# Patient Record
Sex: Male | Born: 1955 | Race: White | Hispanic: No | Marital: Married | Smoking: Never smoker
Health system: Southern US, Community
[De-identification: ages and names within clinical notes are randomized; demographics above are authoritative.]

## PROBLEM LIST (undated history)

## (undated) DIAGNOSIS — M1990 Unspecified osteoarthritis, unspecified site: Secondary | ICD-10-CM

## (undated) DIAGNOSIS — K219 Gastro-esophageal reflux disease without esophagitis: Secondary | ICD-10-CM

## (undated) DIAGNOSIS — M722 Plantar fascial fibromatosis: Secondary | ICD-10-CM

## (undated) DIAGNOSIS — E7800 Pure hypercholesterolemia, unspecified: Secondary | ICD-10-CM

## (undated) DIAGNOSIS — T8859XA Other complications of anesthesia, initial encounter: Secondary | ICD-10-CM

## (undated) HISTORY — PX: HERNIA REPAIR: SHX51

## (undated) HISTORY — PX: SHOULDER ARTHROSCOPY: SHX128

## (undated) HISTORY — PX: LEG SURGERY: SHX1003

## (undated) HISTORY — DX: Plantar fascial fibromatosis: M72.2

## (undated) HISTORY — PX: FRACTURE SURGERY: SHX138

## (undated) HISTORY — PX: COLONOSCOPY: SHX174

---

## 2021-02-23 ENCOUNTER — Emergency Department (HOSPITAL_COMMUNITY): Payer: Medicare PPO

## 2021-02-23 ENCOUNTER — Encounter (HOSPITAL_COMMUNITY): Payer: Self-pay

## 2021-02-23 ENCOUNTER — Other Ambulatory Visit: Payer: Self-pay

## 2021-02-23 ENCOUNTER — Emergency Department (HOSPITAL_COMMUNITY)
Admission: EM | Admit: 2021-02-23 | Discharge: 2021-02-23 | Disposition: A | Discharge status: Home / Self Care | Payer: Medicare PPO | Attending: Emergency Medicine | Admitting: Emergency Medicine

## 2021-02-23 DIAGNOSIS — R002 Palpitations: Secondary | ICD-10-CM | POA: Insufficient documentation

## 2021-02-23 DIAGNOSIS — R0789 Other chest pain: Secondary | ICD-10-CM | POA: Insufficient documentation

## 2021-02-23 DIAGNOSIS — Z5321 Procedure and treatment not carried out due to patient leaving prior to being seen by health care provider: Secondary | ICD-10-CM | POA: Insufficient documentation

## 2021-02-23 HISTORY — DX: Pure hypercholesterolemia, unspecified: E78.00

## 2021-02-23 LAB — CBC
HCT: 42 % (ref 39.0–52.0)
Hemoglobin: 14 g/dL (ref 13.0–17.0)
MCH: 31.1 pg (ref 26.0–34.0)
MCHC: 33.3 g/dL (ref 30.0–36.0)
MCV: 93.3 fL (ref 80.0–100.0)
Platelets: 181 10*3/uL (ref 150–400)
RBC: 4.5 MIL/uL (ref 4.22–5.81)
RDW: 12.9 % (ref 11.5–15.5)
WBC: 7.6 10*3/uL (ref 4.0–10.5)
nRBC: 0 % (ref 0.0–0.2)

## 2021-02-23 LAB — BASIC METABOLIC PANEL
Anion gap: 5 (ref 5–15)
BUN: 17 mg/dL (ref 8–23)
CO2: 28 mmol/L (ref 22–32)
Calcium: 9.1 mg/dL (ref 8.9–10.3)
Chloride: 106 mmol/L (ref 98–111)
Creatinine, Ser: 0.94 mg/dL (ref 0.61–1.24)
GFR, Estimated: >60 mL/min (ref >60)
Glucose, Bld: 96 mg/dL (ref 70–99)
Potassium: 3.9 mmol/L (ref 3.5–5.1)
Sodium: 139 mmol/L (ref 135–145)

## 2021-02-23 LAB — TROPONIN I (HIGH SENSITIVITY): Troponin I (High Sensitivity): 2 ng/L (ref <18)

## 2021-02-23 IMAGING — CR DG CHEST 2V
2 series · 2 of 2 positions shown · non-contrast
Comparison: None.

CLINICAL DATA: Chest pain

EXAM:
CHEST - 2 VIEW

[w chest pa]
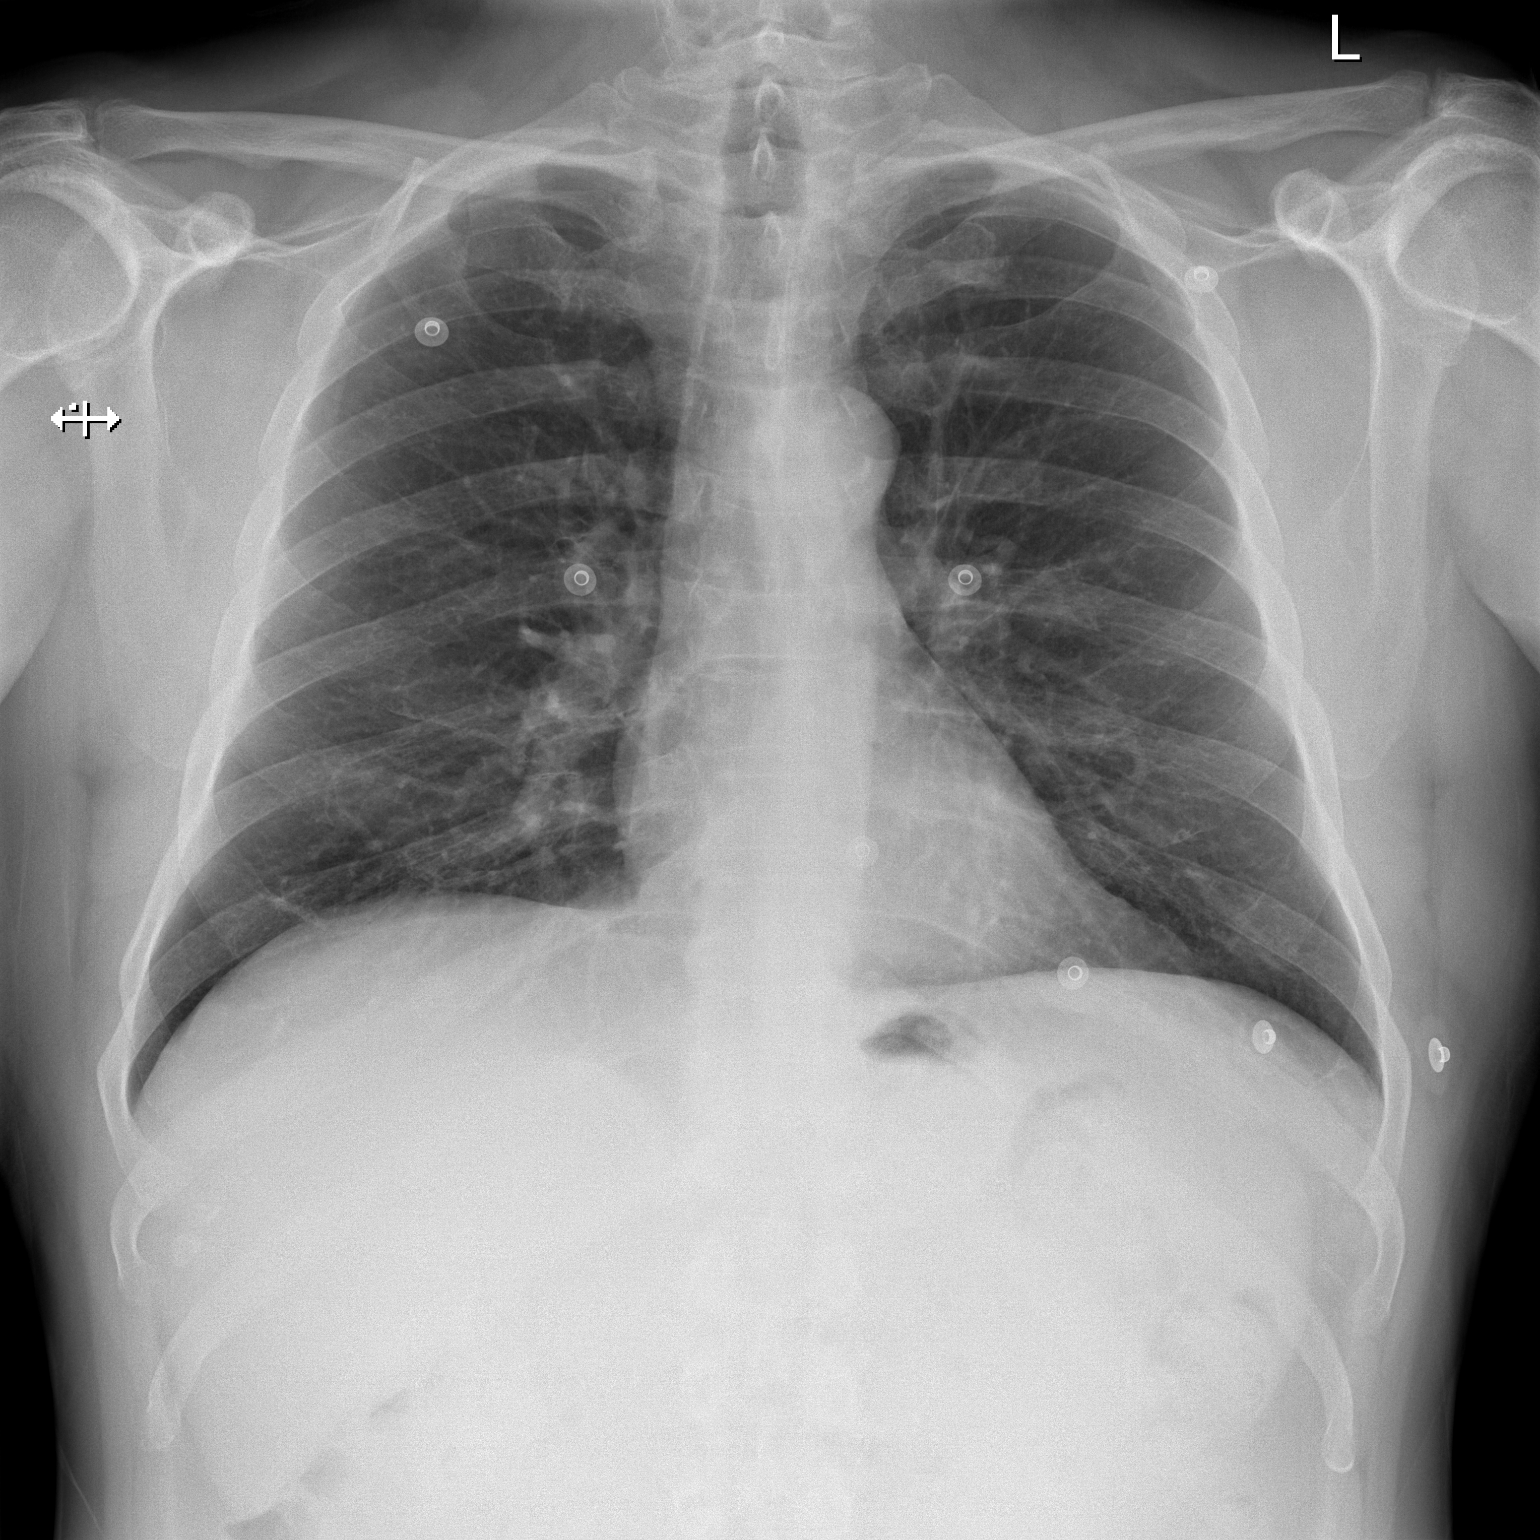

[w chest lat]
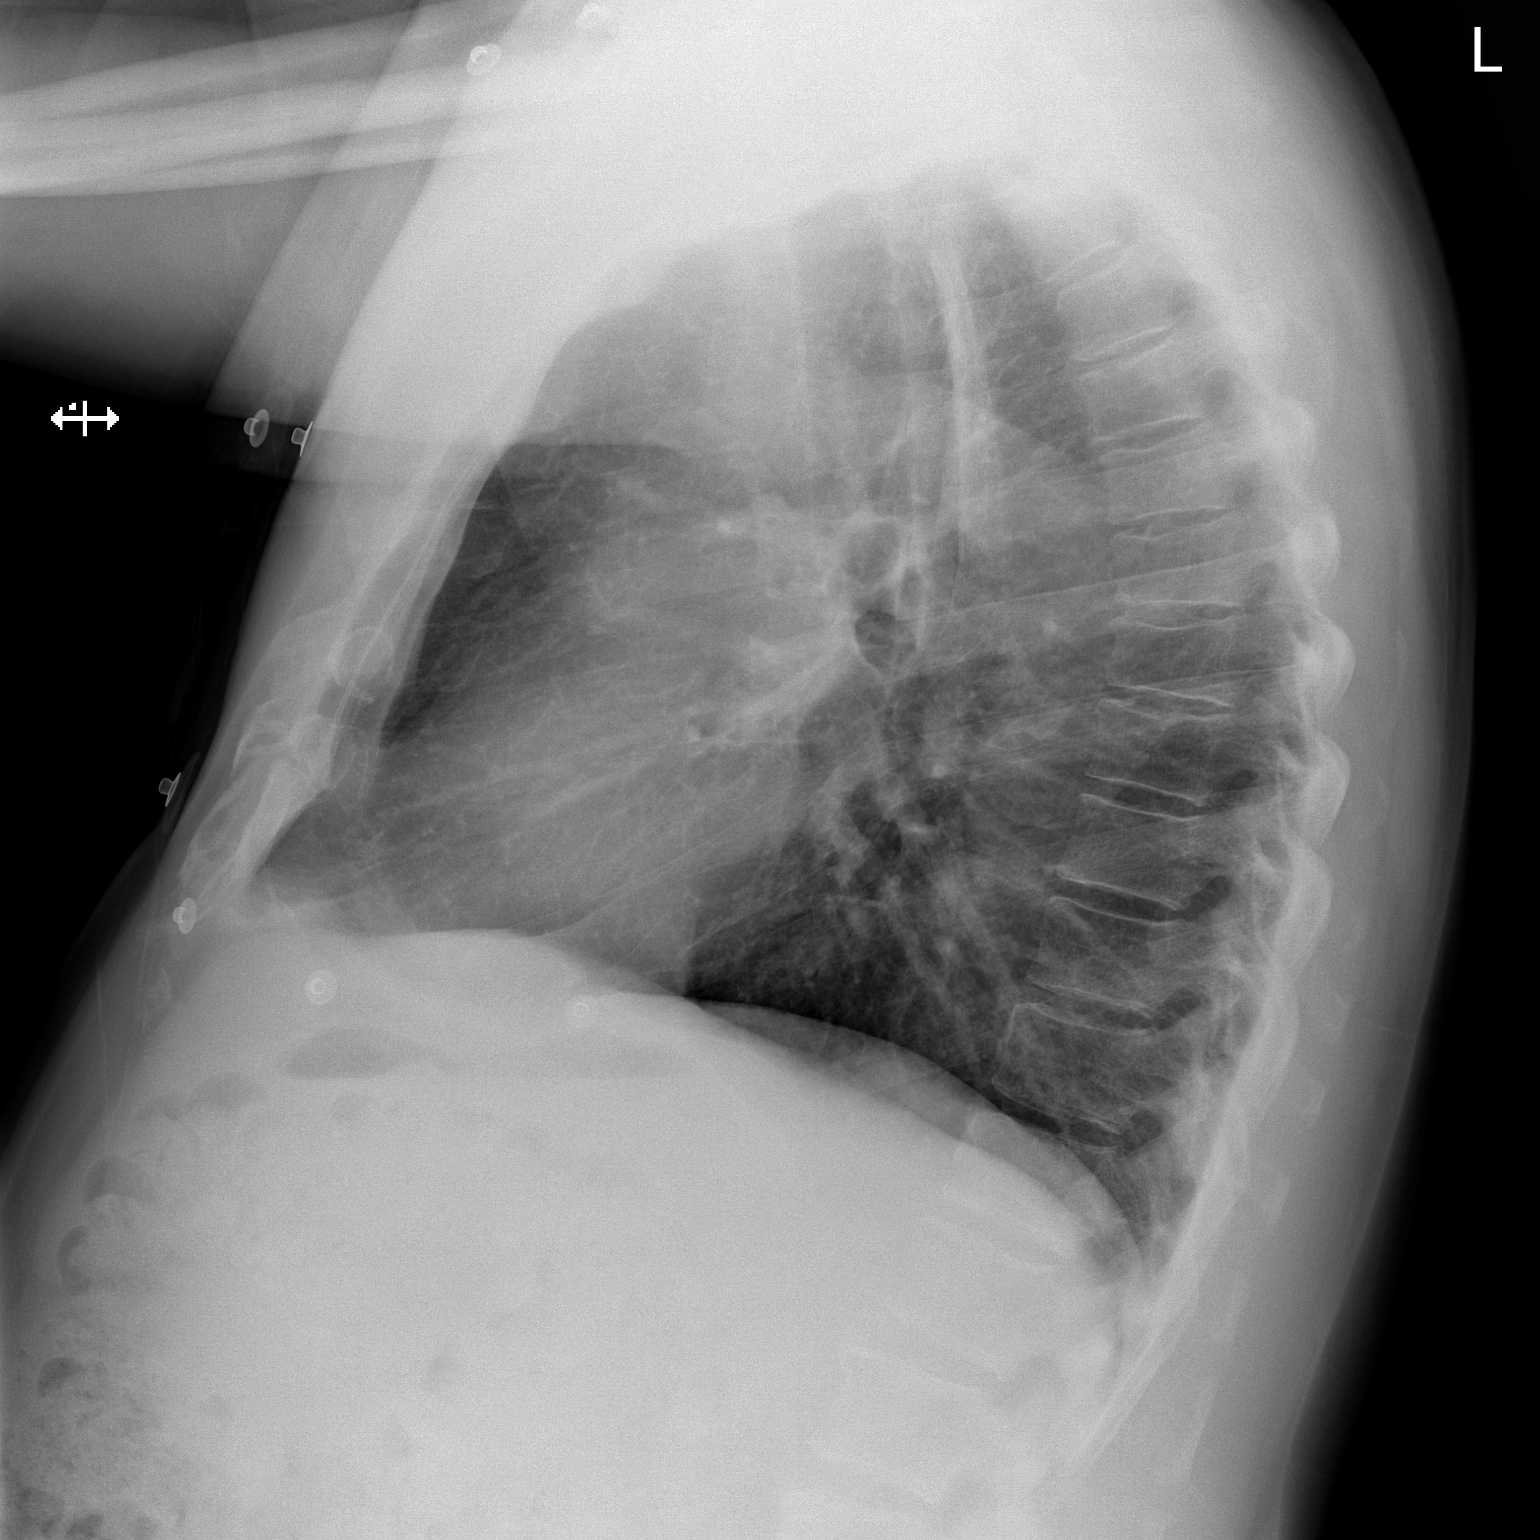

[2 of 2 positions shown; findings below may reference images not displayed]

FINDINGS: The heart size and mediastinal contours are within normal limits.
Both lungs are clear. No pleural effusion or pneumothorax. No acute
osseous abnormality. Mild chronic midthoracic compression
deformities.
IMPRESSION: No acute process in the chest.

## 2021-02-23 NOTE — ED Notes (Signed)
PT stated he was leaving

## 2021-02-23 NOTE — ED Provider Notes (Signed)
Emergency Medicine Provider Triage Evaluation Note  Jimmy Huynh 65 y.o. male was evaluated in triage.  Pt complains of episode of chest pain that occurred 3 days ago.  He reports that 3 days ago developed 11:30 p.m. at night, he had an episode of left-sided chest pain that he describes as sharp.  He did not have any associated shortness of breath, nausea/vomiting.  He states that it resolved.  He has not had any episodes since then.  Yesterday he did notice some palpitations.  He called his doctor this morning and was advised to come to emergency department for further evaluation.  Currently denies any chest pain.  Denies any fevers, cough, nausea/vomiting.   Review of Systems  Positive: CP, palpiations Negative: N/v, fever  Physical Exam  BP 134/82   Pulse 70   Temp 98.2 F (36.8 C) (Oral)   Resp 18   Ht 5\' 4"  (1.626 m)   Wt 65.8 kg   SpO2 100%   BMI 24.89 kg/m  Gen:   Awake, no distress   HEENT:  Atraumatic  Resp:  Normal effort  Cardiac:  Normal rate  Abd:   Nondistended, nontender  MSK:   Moves extremities without difficulty  Neuro:  Speech clear   Other:      Medical Decision Making  Medically screening exam initiated at 12:22 PM  Appropriate orders placed.  Jimmy Huynh was informed that the remainder of the evaluation will be completed by another provider, this initial triage assessment does not replace that evaluation. They are counseled that they will need to remain in the ED until the completion of their workup, including full H&P and results of any tests.  Risks of leaving the emergency department prior to completion of treatment were discussed. Patient was advised to inform ED staff if they are leaving before their treatment is complete. The patient acknowledged these risks and time was allowed for questions.     The patient appears stable so that the remainder of the MSE may be completed by another provider.   Clinical Impression  CP   Portions of this note  were generated with Dragon dictation software. Dictation errors may occur despite best attempts at proofreading.      Jimmy Quant, PA-C 02/23/21 1223    02/25/21, DO 02/23/21 1523

## 2021-02-23 NOTE — ED Triage Notes (Addendum)
Patient states he had left chest pain that radiated into the left back x 10 minutes 3 days ago and states none since. Patient states he called his physician today and was told to come to the ED.  Patient added that he had palpitations yesterday while sleeping and it woke him up.

## 2021-04-14 ENCOUNTER — Other Ambulatory Visit: Payer: Self-pay | Admitting: Student

## 2021-04-14 DIAGNOSIS — M25561 Pain in right knee: Secondary | ICD-10-CM

## 2021-04-14 DIAGNOSIS — G8929 Other chronic pain: Secondary | ICD-10-CM

## 2021-04-14 DIAGNOSIS — S83241A Other tear of medial meniscus, current injury, right knee, initial encounter: Secondary | ICD-10-CM

## 2021-04-23 ENCOUNTER — Other Ambulatory Visit: Payer: Self-pay

## 2021-04-23 ENCOUNTER — Ambulatory Visit
Admission: RE | Admit: 2021-04-23 | Discharge: 2021-04-23 | Disposition: A | Discharge status: Home / Self Care | Payer: Medicare PPO | Source: Ambulatory Visit | Attending: Student | Admitting: Student

## 2021-04-23 DIAGNOSIS — S83241A Other tear of medial meniscus, current injury, right knee, initial encounter: Secondary | ICD-10-CM | POA: Insufficient documentation

## 2021-04-23 DIAGNOSIS — G8929 Other chronic pain: Secondary | ICD-10-CM | POA: Diagnosis present

## 2021-04-23 DIAGNOSIS — M25561 Pain in right knee: Secondary | ICD-10-CM | POA: Diagnosis present

## 2021-04-23 IMAGING — MR MR KNEE*R* W/O CM
7 series · 40 of 40 positions shown · non-contrast
Comparison: None.

CLINICAL DATA: Chronic right anterior knee pain.  No prior surgery.

EXAM:
MRI OF THE RIGHT KNEE WITHOUT CONTRAST
TECHNIQUE: Multiplanar, multisequence MR imaging of the knee was performed. No
intravenous contrast was administered.

[Series 15: T2 fat-sat · axial · right · 4.0mm · 0.50mm/px · z∈[-85,+39]mm · 6 of 26 slices shown (1 of 3)]
[im 1/26]
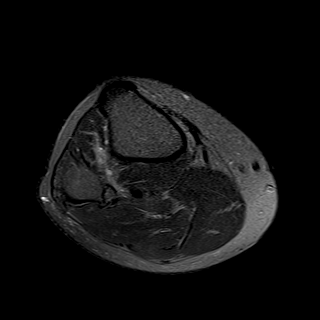
[im 6/26]
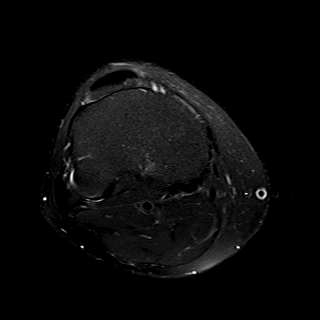
[im 11/26]
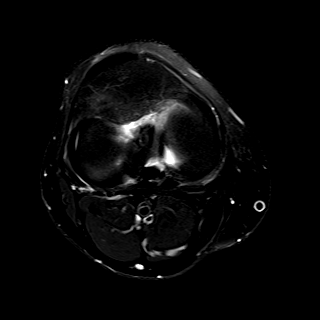
[im 16/26]
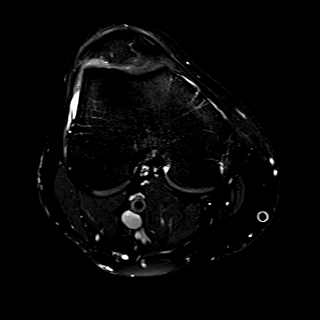
[im 21/26]
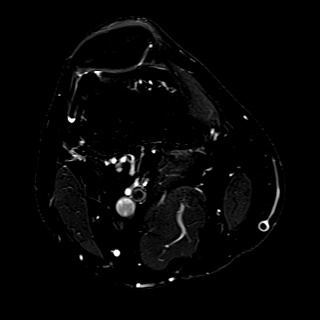
[im 26/26]
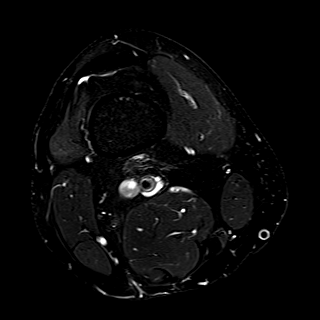

[Series 16: T1 · coronal · right · 4.0mm · 0.47mm/px · 6 of 28 slices shown]
[im 1/28]
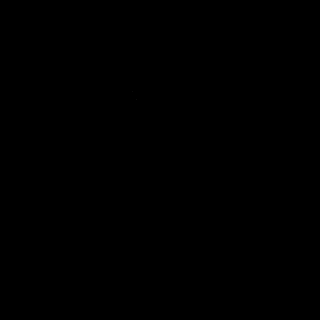
[im 6/28]
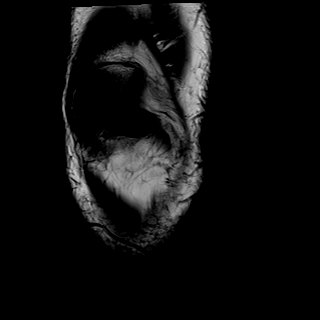
[im 11/28]
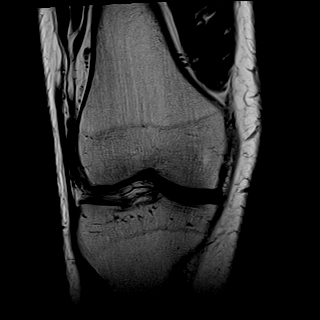
[im 17/28]
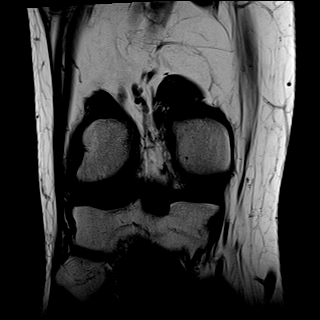
[im 22/28]
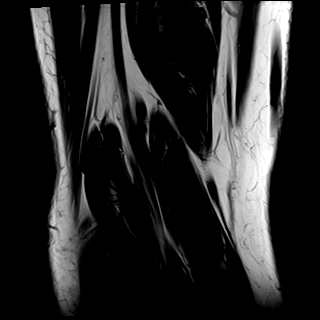
[im 28/28]
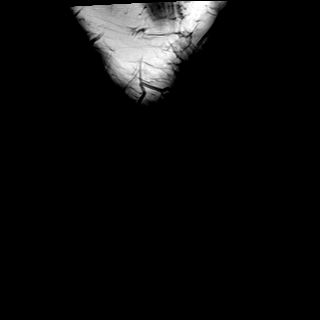

[Series 17: T2 fat-sat · coronal · right · 4.0mm · 0.47mm/px · 6 of 28 slices shown (2 of 3)]
[im 1/28]
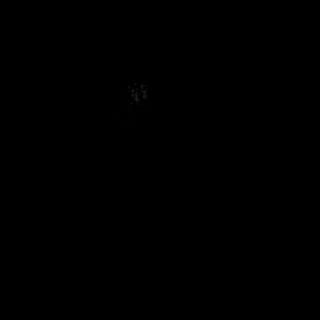
[im 6/28]
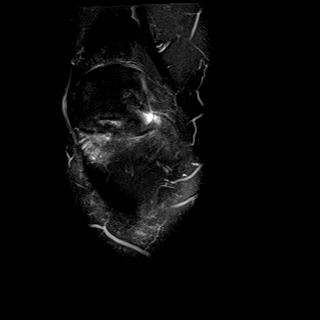
[im 11/28]
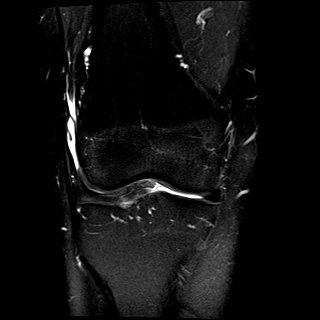
[im 17/28]
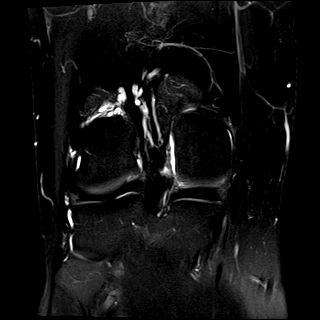
[im 22/28]
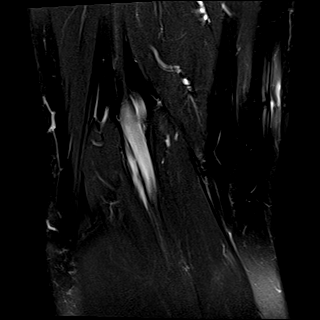
[im 28/28]
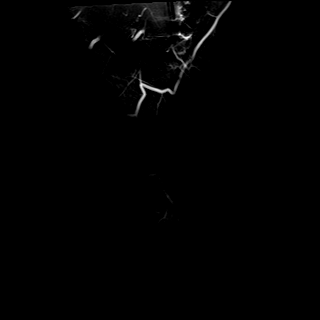

[Series 18: PD fat-sat · coronal · right · 4.0mm · 0.59mm/px · 6 of 28 slices shown (1 of 2)]
[im 1/28]
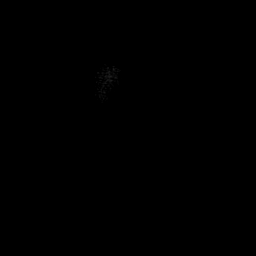
[im 6/28]
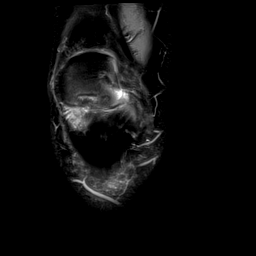
[im 11/28]
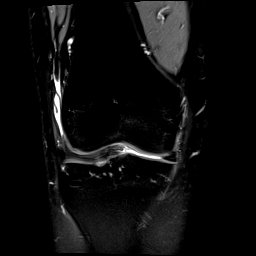
[im 17/28]
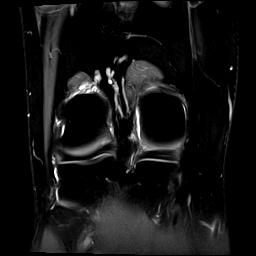
[im 22/28]
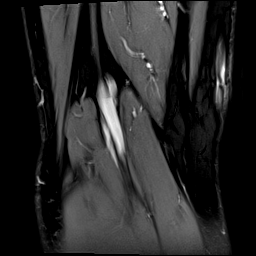
[im 28/28]
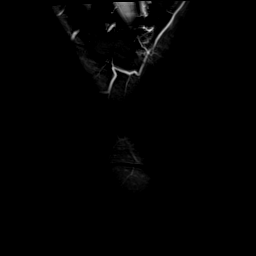

[Series 19: PD fat-sat · sagittal · right · 3.0mm · 0.47mm/px · 7 of 34 slices shown (2 of 2)]
[im 1/34]
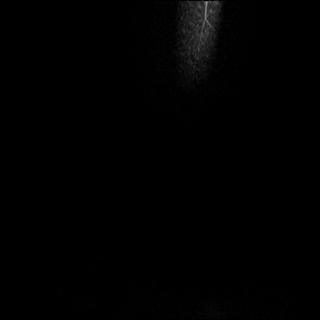
[im 6/34]
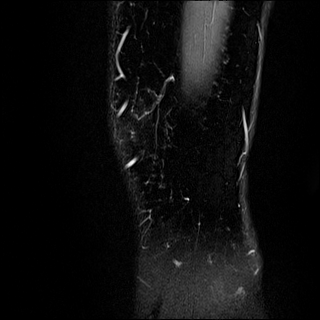
[im 12/34]
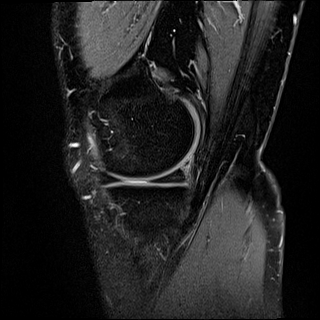
[im 17/34]
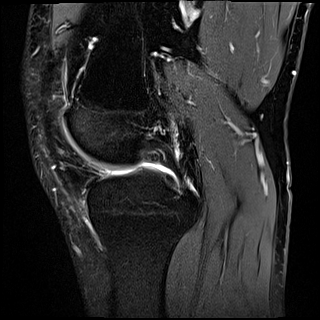
[im 23/34]
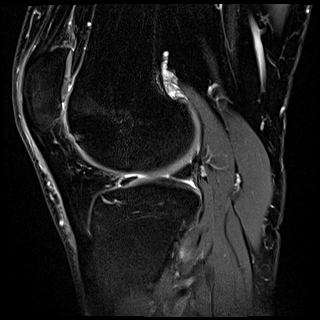
[im 28/34]
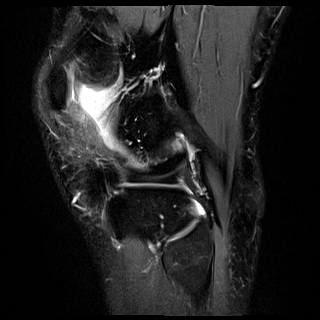
[im 34/34]
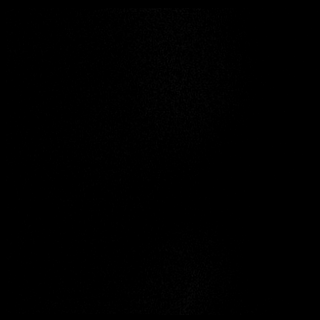

[Series 20: T2 fat-sat · sagittal · right · 3.0mm · 0.47mm/px · 7 of 33 slices shown (3 of 3)]
[im 1/33]
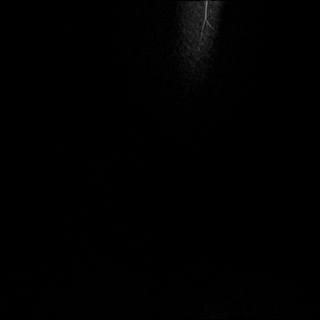
[im 6/33]
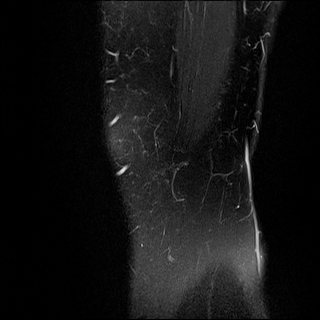
[im 11/33]
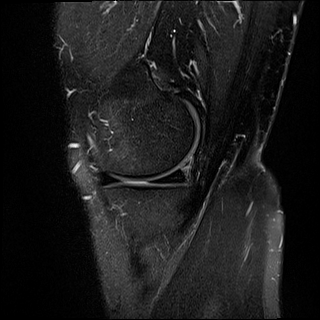
[im 17/33]
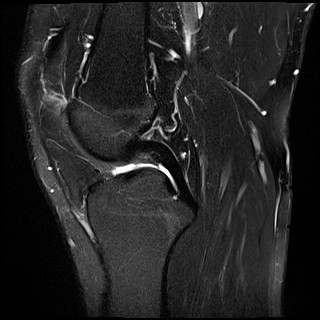
[im 22/33]
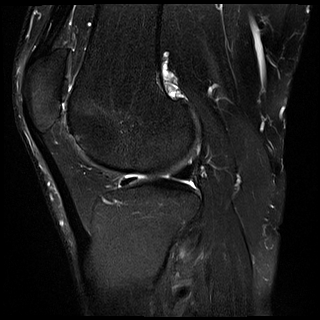
[im 27/33]
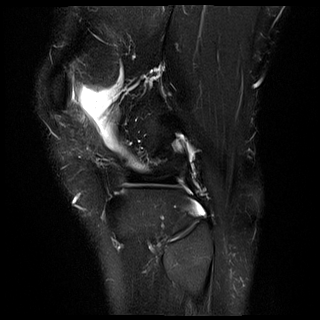
[im 33/33]
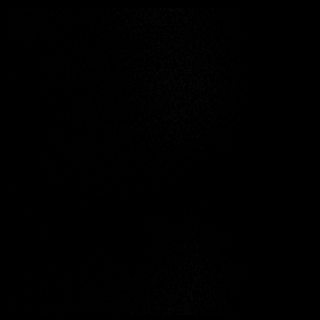

[Series 21: PD · coronal · right · 2.0mm · 0.47mm/px · 2 of 10 slices shown]
[im 1/10]
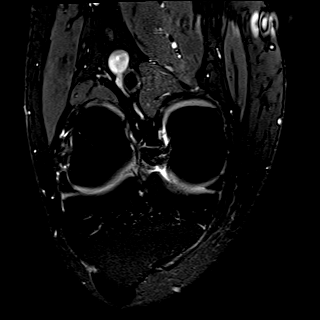
[im 10/10]
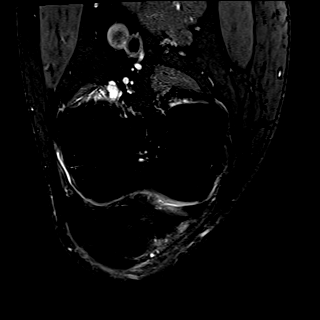

[40 of 40 positions shown; findings below may reference images not displayed]

FINDINGS: MENISCI

Medial meniscus:  Intact.

Lateral meniscus:  Intact.

LIGAMENTS

Cruciates:  Intact ACL and PCL.

Collaterals: Medial collateral ligament is intact. Lateral
collateral ligament complex is intact.

CARTILAGE

Patellofemoral: Focal full-thickness and near full-thickness
cartilage loss over the lateral trochlea and inferior lateral
patellar facet. Partial-thickness cartilage loss over the central
patellar apex.

Medial:  No chondral defect.

Lateral:  No chondral defect.

Joint: No joint effusion. Mild edema in the superolateral aspect of
Hoffa's fat.

Popliteal Fossa:  No Baker cyst. Intact popliteus tendon.

Extensor Mechanism: Intact quadriceps tendon and patellar tendon.
Intact medial and lateral patellar retinaculum. Intact MPFL.

Bones: No focal marrow signal abnormality. No fracture or
dislocation.

Other: None.
IMPRESSION: 1. No meniscal or ligamentous injury.
2. Mild to moderate lateral patellofemoral compartment
osteoarthritis.
3. Mild edema in the superolateral aspect of Hoffa's fat pad, which
can be seen in the setting of patellar tendon-lateral femoral
condyle friction syndrome.

## 2021-10-30 NOTE — Progress Notes (Signed)
Virtual Visit via Video Note  I connected with Jimmy Huynh on 11/04/21 at 11:00 AM EST by a video enabled telemedicine application and verified that I am speaking with the correct person using two identifiers.  Location: Patient: home Provider: office Persons participated in the visit- patient, provider    I discussed the limitations of evaluation and management by telemedicine and the availability of in person appointments. The patient expressed understanding and agreed to proceed.   I discussed the assessment and treatment plan with the patient. The patient was provided an opportunity to ask questions and all were answered. The patient agreed with the plan and demonstrated an understanding of the instructions.   The patient was advised to call back or seek an in-person evaluation if the symptoms worsen or if the condition fails to improve as anticipated.  I provided 59 minutes of non-face-to-face time during this encounter.   Neysa Hotter, MD     Psychiatric Initial Adult Assessment   Patient Identification: Jimmy Huynh MRN:  [id number] Date of Evaluation:  11/04/2021 Referral Source: Jim Like, MD  Chief Complaint:   Chief Complaint   Establish Care   "I'm out of control"  Visit Diagnosis:    ICD-10-CM   1. GAD (generalized anxiety disorder)  F41.1       History of Present Illness:   Jimmy Huynh is a 66 y.o. year old male with a history of anxiety, high cholesterol, planter fasciitis, who is referred for anxiety.   He states that he made his appointment due to the symptoms he experiences.  He has been on venlafaxine for more than 20 years for anxiety and "ADHD".  He started to notice that he has difficult sensation, which she thinks is due to discontinuation symptoms.  He describes this as being in a "55 gallon of drum, rolling down the hill." He thinks he has "wobbly head" especially when he missed to take the medication.  He feels out of control, and "losing  it."  Although he tried to take additional dose around noon, it was not effective. Although his anxiety was extreme in the past, he does not think it has been interfering with his daily activity anymore.  He reports great relationship with his wife.  He also reports good relationship with his son at home.  His son had methampheamine use disorder, and overdosed on fentanyl in the past.  He has been in sobriety for the past year, and has been doing well.  Annette Stable enjoys working on home projects.  He is hoping to enjoy fishing in spring, referring to 3 boats he possesses.   Anxiety- He occasionally feels anxious about anything.  He talks about an example of him being worried about [place] dollar collapse.  However, he does not think it has been affecting him as much.  He feels irritable at times. He has good appetite.   Concentration- He was told by his PCP that he has adult ADHD.  He states that his focus improved significantly since he has been on venlafaxine.  He talks about an example of him being unable to work on car prior to starting this medication.  He reports family history of asperger disease in his brothter, father.   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   Substance-He started alcohol use at age 57.  He states that there was a culture of substance use when he was in college.  He used "every drug available," mainly cocaine and marijuana.  He discontinued the substance for his career/work at logistic distribution.  He also stopped drinking after the divorce.  He has been in sobriety since 66 year old.  He denies any craving.  He has not received any treatment for his substance use.  He finds his spirituality to be very helpful for sobriety.   Physical symptoms- He reports vertigo, tinnitus over the past several months.  He also has  optical migraine, which is excertabated by any stress.  He notices that it has improved since he reduced the dose of Aleve and Tylenol for the past 2 weeks.  He has an upcoming appointment with his neurologist.   Medication- venlafaxine XR 112.5 mg daly (on venlfaxine for 67 years)  Functional Status Instrumental Activities of Daily Living (IADLs):  Jimie Kuwahara is independent in the following: medications, driving Requires assistance with the following: managing finances (his wife has been handling this for many years)  Activities of Daily Living (ADLs):  Nole Robey is independent in the following: bathing and hygiene, feeding, continence, grooming and toileting, walking   Daily routine: work on the house project Support: wife Household: wife, 22 yo son with methamphetamine use disorder Marital status: married for 21 years, divorced before Number of children: 2 son (1 son was adopted). 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 Last PCP / ongoing medical evaluation:     Associated Signs/Symptoms: Depression Symptoms:   denies (Hypo) Manic Symptoms:   denies decreased need for sleep, euphoria Anxiety Symptoms:   mild anxiety Psychotic Symptoms:   denies AH, Vh, paranoia PTSD Symptoms: Had a traumatic exposure:  some trauma in childhood Re-experiencing:  None Hypervigilance:  No Hyperarousal:  None Avoidance:  None  Past Psychiatric History:  Outpatient: used to see a psychotherapist Psychiatry admission: denies Previous suicide attempt: denies  Past trials of medication: sertraline, fluoxetine, lexapro,  History of violence:  denies  Previous Psychotropic Medications: Yes   Substance Abuse History in the last 12 months:  No.  Consequences of Substance Abuse: NA  Past Medical History:  Past Medical History:  Diagnosis Date   High cholesterol    Plantar fasciitis     Past Surgical History:  Procedure Laterality  Date   HERNIA REPAIR     x3    LEG SURGERY Left     Family Psychiatric History:  As below  Family History:  Family History  Problem Relation Age of Onset   Alcohol abuse Maternal Uncle    Alcohol abuse Paternal Grandfather    Depression Niece     Social History:   Social History   Socioeconomic History   Marital status: Unknown    Spouse name: Not on file   Number of children: Not on file   Years of education: Not on file   Highest education level: Not on file  Occupational History   Not on file  Tobacco Use   Smoking status: Never   Smokeless tobacco: Never  Vaping Use   Vaping Use: Never used  Substance and Sexual Activity   Alcohol use: Not Currently   Drug use: Not Currently   Sexual activity: Not on file  Other Topics Concern   Not on file  Social History Narrative   Not on file   Social Determinants of Health   Financial  Resource Strain: Not on file  Food Insecurity: Not on file  Transportation Needs: Not on file  Physical Activity: Not on file  Stress: Not on file  Social Connections: Not on file    Additional Social History: as above  Allergies:  No Known Allergies  Metabolic Disorder Labs: No results found for: HGBA1C, MPG No results found for: PROLACTIN No results found for: CHOL, TRIG, HDL, CHOLHDL, VLDL, LDLCALC No results found for: TSH  Therapeutic Level Labs: No results found for: LITHIUM No results found for: CBMZ No results found for: VALPROATE  Current Medications: Current Outpatient Medications  Medication Sig Dispense Refill   aspirin EC 81 MG tablet Take 81 mg by mouth daily. Swallow whole.     DULoxetine (CYMBALTA) 30 MG capsule Take 1 capsule (30 mg total) by mouth daily. 30 capsule 0   loratadine (CLARITIN) 10 MG tablet Take 10 mg by mouth daily.     rosuvastatin (CRESTOR) 5 MG tablet Take 5 mg by mouth daily.     No current facility-administered medications for this visit.    Musculoskeletal: Strength & Muscle  Tone:  N/A Gait & Station:  N/A Patient leans: N/A  Psychiatric Specialty Exam: Review of Systems  Psychiatric/Behavioral:  Positive for decreased concentration. Negative for agitation, behavioral problems, confusion, dysphoric mood, hallucinations, self-injury, sleep disturbance and suicidal ideas. The patient is nervous/anxious. The patient is not hyperactive.   All other systems reviewed and are negative.  There were no vitals taken for this visit.There is no height or weight on file to calculate BMI.  General Appearance: Fairly Groomed  Eye Contact:  Good  Speech:  Clear and Coherent  Volume:  Normal  Mood:  Anxious  Affect:  Appropriate, Congruent, and calm  Thought Process:  Coherent  Orientation:  Full (Time, Place, and Person)  Thought Content:  Logical  Suicidal Thoughts:  No  Homicidal Thoughts:  No  Memory:  Immediate;   Good  Judgement:  NA  Insight:  Good  Psychomotor Activity:  Normal  Concentration:  Concentration: Good and Attention Span: Good  Recall:  Good  Fund of Knowledge:Good  Language: Good  Akathisia:  No  Handed:  Right  AIMS (if indicated):  not done  Assets:  Communication Skills Desire for Improvement  ADL's:  Intact  Cognition: WNL  Sleep:  Good   Screenings: PHQ2-9    Flowsheet Row Video Visit from [facility]04/2021 [facility]  PHQ-2 Total Score 0      Flowsheet Row ED from 02/23/2021 in [facility]GENCY DEPT  C-SSRS RISK CATEGORY No Risk       Assessment and Plan:  Jimmy QuantWilliam Childers is a 66 y.o. 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   1. GAD (generalized anxiety disorder) He describes symptoms consistent with discontinuation symptoms from venlafaxine, which he has been taking for more than 20 years.  Although he reports occasional  anxiety and irritability, it has been self-limited, and denies any other sigfniciant mood symptoms. Psychosocial stressors includes retirement, and his son at home, who has history of drug use (he has been in sobriety, and is doing better). He  reports good relationship with his wife, and enjoys working on his house projects.  Will do a cross-taper to duloxetine slowly to see if this mitigates his symptoms.  Discussed potential risk of serotonin syndrome, discontinuation symptoms, hypertension  and headache.  Noted that although fluoxetine will be a preferred agent given its half life, will not try this this time given his preference to try medication he has not taken before. Will have sooner appointment to monitor both his symptoms/mood with this medication change.   # inattention He states that he was told he has adult ADHD, and reports good benefit from venlafaxine. It is unclear whether this was more attributable to anxiety he used to experience.  Will continue to evaluate.   # r/o cognitive impairment He reports episodes of having difficulty in working memory.  ADL/IADL is independent.  It is difficult to discern whether this is more attributable discontinuation symptoms he is experiencing.  Noted that he also has physical symptoms of dizziness/vertigo in the context of taking high dose of NSAIDs/acetaminophen.  He has an upcoming appointment with neurologist.  Will continue to monitor.   Plan Decrease venlafaxine 75 mg daily for weeks, then 37.5 mg daily for two weeks, then discontinue  Start Duloxetine 30 mg daily (take along with venlafaxine) Next appointment: 2/22 at 11:30 for 30 mins, video  The patient demonstrates the following risk factors for suicide: Chronic risk factors for suicide include: psychiatric disorder of anxiety, and chronic pain. Acute risk factors for suicide include: unemployment. Protective factors for this patient include: positive social support, responsibility to others  (children, family), coping skills, hope for the future, and life satisfaction. Considering these factors, the overall suicide risk at this point appears to be low. Patient is appropriate for outpatient follow up.   Neysa Hottereina Sharlisa Hollifield, MD 2/1/202312:15 PM

## 2021-11-03 ENCOUNTER — Telehealth: Payer: Self-pay | Admitting: Psychiatry

## 2021-11-04 ENCOUNTER — Encounter: Payer: Self-pay | Admitting: Psychiatry

## 2021-11-04 ENCOUNTER — Other Ambulatory Visit: Payer: Self-pay

## 2021-11-04 ENCOUNTER — Telehealth (INDEPENDENT_AMBULATORY_CARE_PROVIDER_SITE_OTHER): Payer: Medicare HMO | Admitting: Psychiatry

## 2021-11-04 DIAGNOSIS — F411 Generalized anxiety disorder: Secondary | ICD-10-CM | POA: Diagnosis not present

## 2021-11-04 MED ORDER — DULOXETINE HCL 30 MG PO CPEP: 30.0000 mg | ORAL_CAPSULE | Freq: Every day | ORAL | 0 refills | Status: DC

## 2021-11-04 NOTE — Patient Instructions (Addendum)
Decrease venlafaxine 75 mg daily for weeks, then 37.5 mg daily for two weeks, then discontinue  Start Duloxetine 30 mg daily  (take along with venlafaxine) Next appointment: 2/22 at 11:30

## 2021-11-06 ENCOUNTER — Other Ambulatory Visit: Payer: Self-pay | Admitting: Family Medicine

## 2021-11-06 ENCOUNTER — Other Ambulatory Visit (HOSPITAL_COMMUNITY): Payer: Self-pay | Admitting: Family Medicine

## 2021-11-06 DIAGNOSIS — R221 Localized swelling, mass and lump, neck: Secondary | ICD-10-CM

## 2021-11-09 ENCOUNTER — Other Ambulatory Visit: Payer: Self-pay | Admitting: Physician Assistant

## 2021-11-09 DIAGNOSIS — R42 Dizziness and giddiness: Secondary | ICD-10-CM

## 2021-11-11 ENCOUNTER — Ambulatory Visit
Admission: RE | Admit: 2021-11-11 | Discharge: 2021-11-11 | Disposition: A | Discharge status: Home / Self Care | Payer: Medicare HMO | Source: Ambulatory Visit | Attending: Family Medicine | Admitting: Family Medicine

## 2021-11-11 DIAGNOSIS — R221 Localized swelling, mass and lump, neck: Secondary | ICD-10-CM | POA: Insufficient documentation

## 2021-11-11 IMAGING — US US THYROID
1 series · 14 of 25 positions shown · non-contrast
Comparison: None.

CLINICAL DATA: Neck swelling

EXAM:
THYROID ULTRASOUND
TECHNIQUE: Ultrasound examination of the thyroid gland and adjacent soft
tissues was performed.

[Series 1001: thyroid us · 14 of 60 slices shown]
[im 1/60]
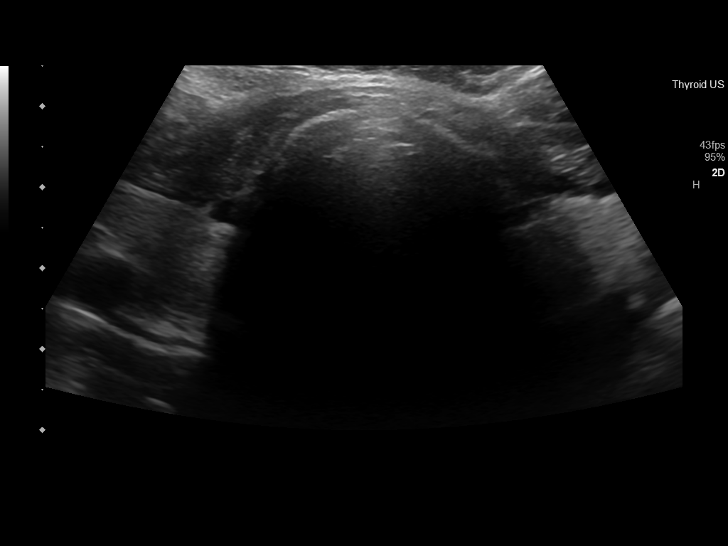
[im 5/60]
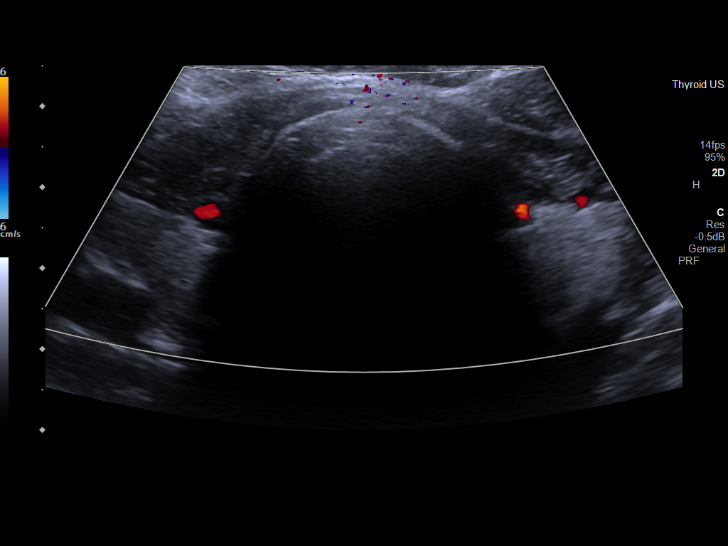
[im 10/60]
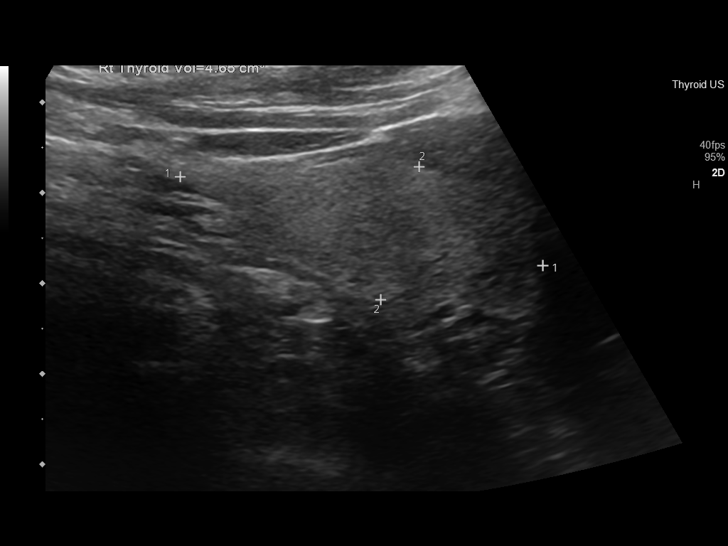
[im 15/60]
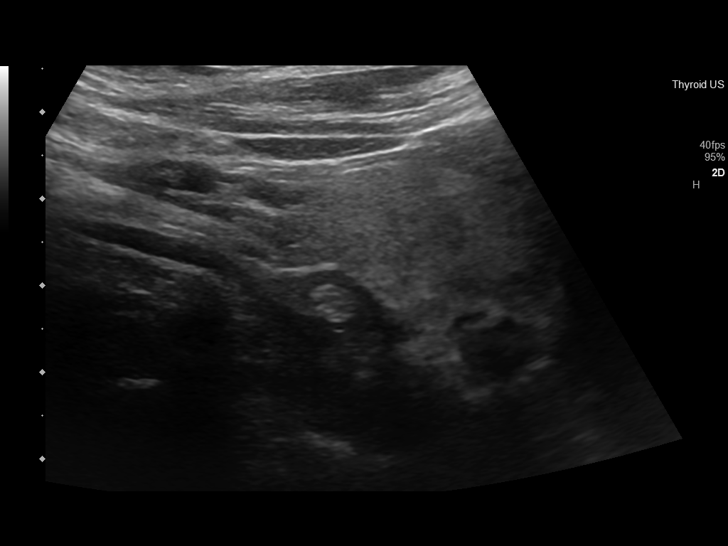
[im 20/60]
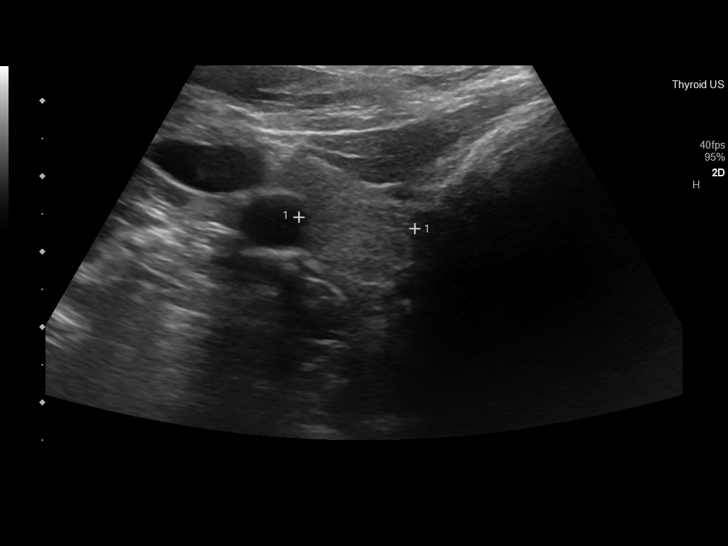
[im 23/60]
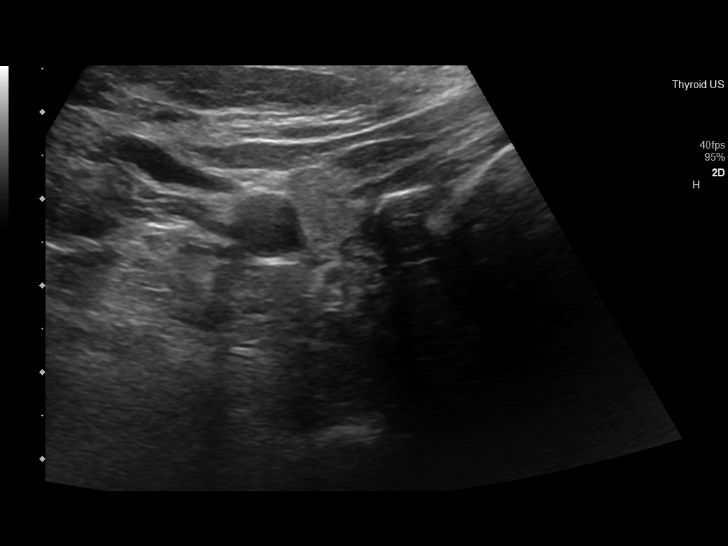
[im 28/60]
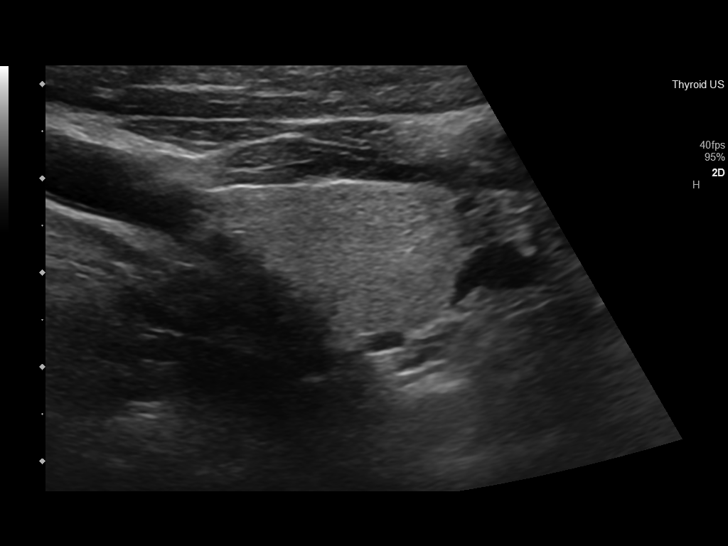
[im 32/60]
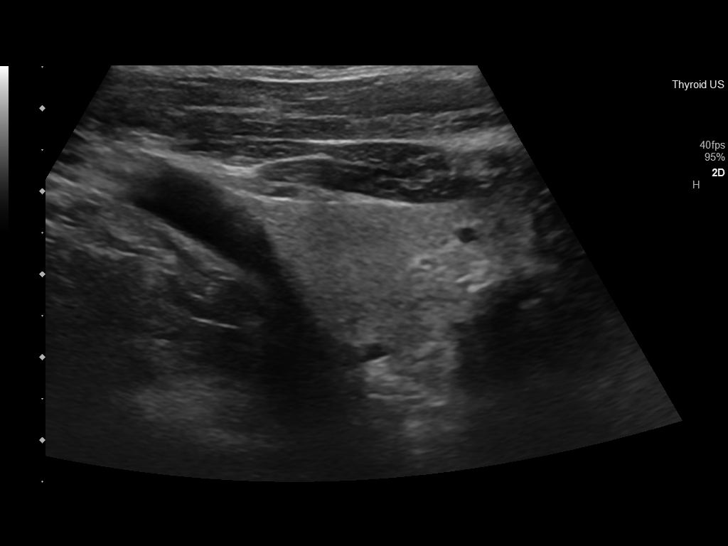
[im 37/60]
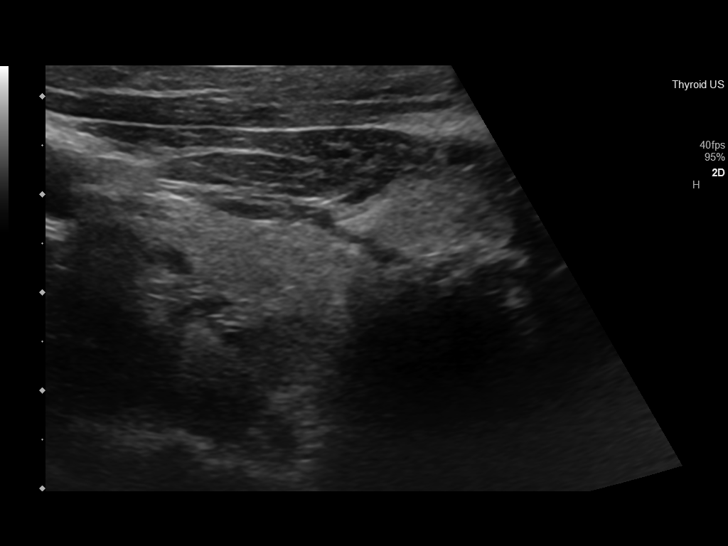
[im 40/60]
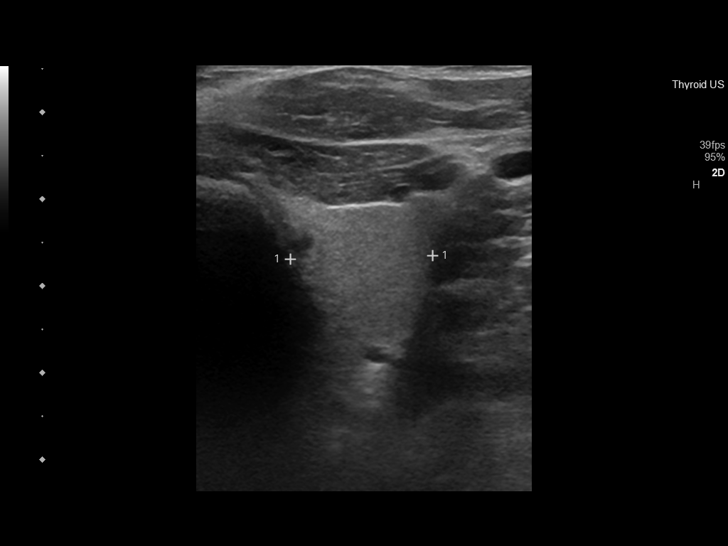
[im 45/60]
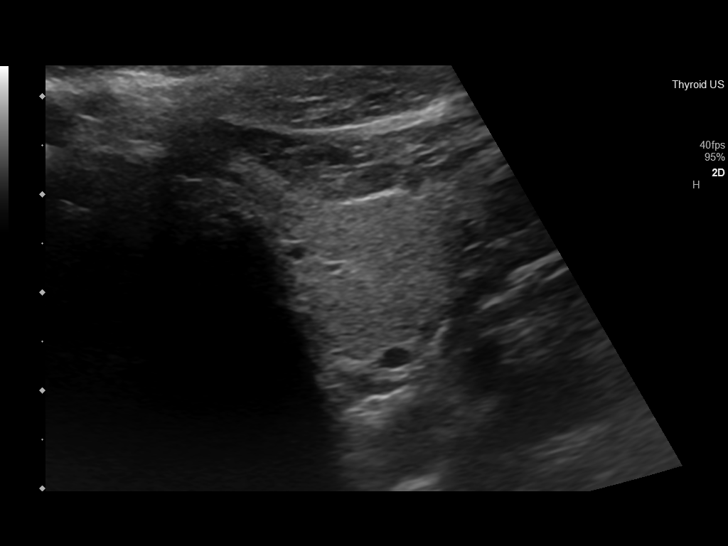
[im 50/60]
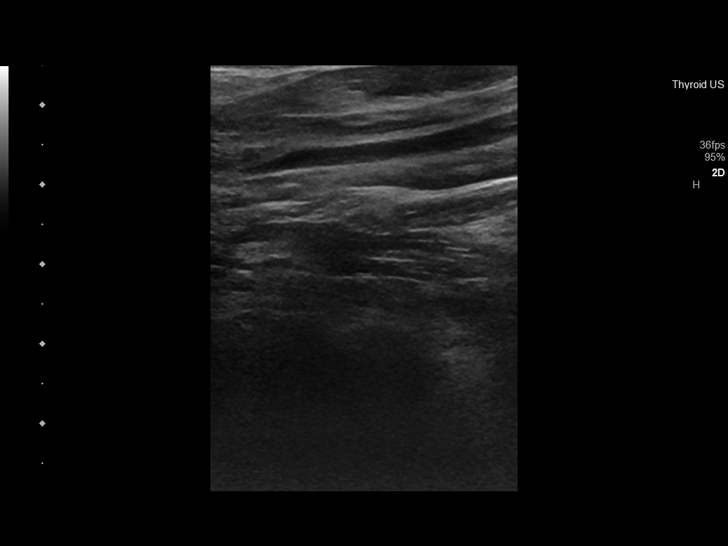
[im 55/60]
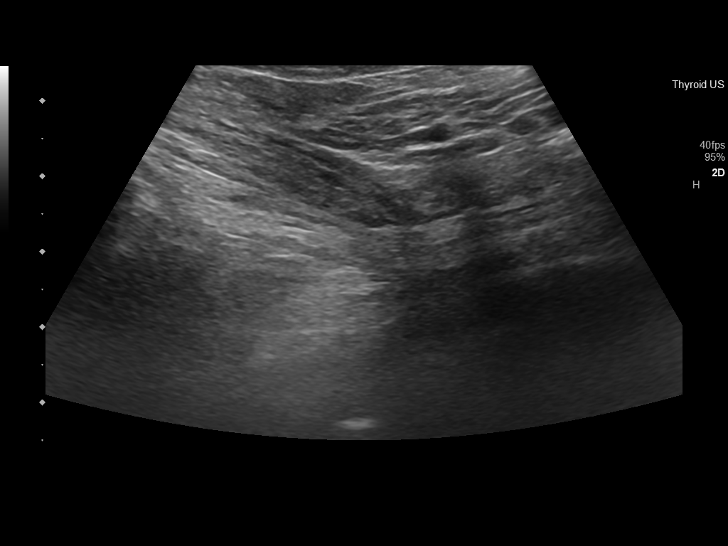
[im 60/60]
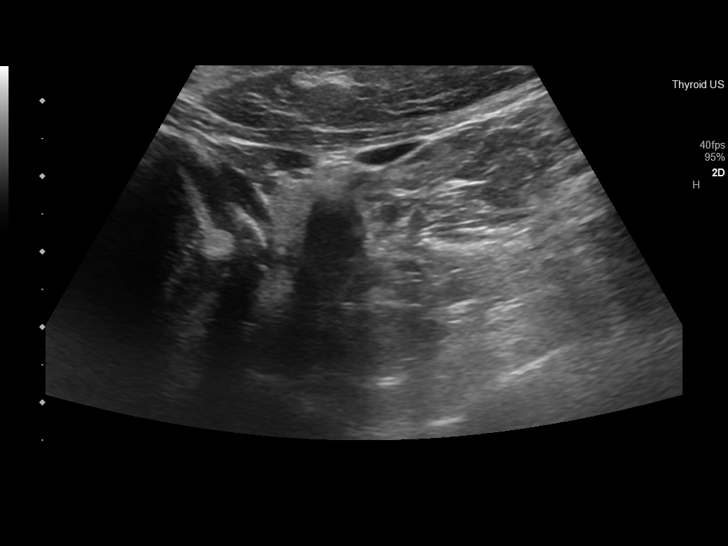

[14 of 25 positions shown; findings below may reference images not displayed]

FINDINGS: Parenchymal Echotexture: Normal

Isthmus: 0.3 cm

Right lobe: 4.1 x 1.5 x 1.5 cm

Left lobe: 3.3 x 1.6 x 1.6 cm

_________________________________________________________

Estimated total number of nodules >/= 1 cm: 0

Number of spongiform nodules >/=  2 cm not described below (TR1): 0

Number of mixed cystic and solid nodules >/= 1.5 cm not described
below (TR2): 0

_________________________________________________________

No discrete nodules are seen within the thyroid gland.

Additional examination of the bilateral neck locoregional soft
tissues demonstrates no abnormal lymph nodes.
IMPRESSION: Normal size and appearance of the thyroid gland.

## 2021-11-23 ENCOUNTER — Ambulatory Visit
Admission: RE | Admit: 2021-11-23 | Discharge: 2021-11-23 | Disposition: A | Discharge status: Home / Self Care | Payer: Medicare HMO | Source: Ambulatory Visit | Attending: Physician Assistant | Admitting: Physician Assistant

## 2021-11-23 DIAGNOSIS — R42 Dizziness and giddiness: Secondary | ICD-10-CM | POA: Insufficient documentation

## 2021-11-23 IMAGING — MR MR HEAD WO/W CM
16 series · 48 of 48 positions shown · IV contrast (gadavist)
Comparison: No pertinent prior exams available for comparison.

CLINICAL DATA: Provided history: Dizziness. Additional history
provided by scanning technologist: Difficulty in coherent thought,
difficulty spelling, headache/vertigo.

EXAM:
MRI HEAD WITHOUT AND WITH CONTRAST
TECHNIQUE: Multiplanar, multiecho pulse sequences of the brain and surrounding
structures were obtained without and with intravenous contrast.
CONTRAST:  8mL GADAVIST GADOBUTROL 1 MMOL/ML IV SOLN

[Series 5: ax dwi_tracew · axial · 3.0mm · 0.65mm/px · z∈[-93,+60]mm · 3 of 48 slices shown]
[im 1/48]
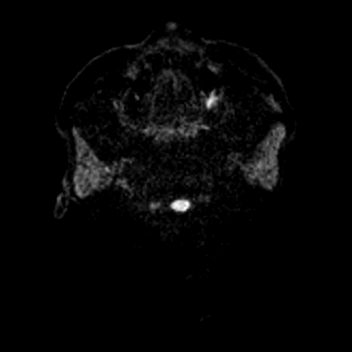
[im 24/48]
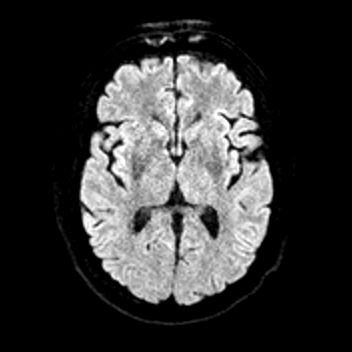
[im 48/48]
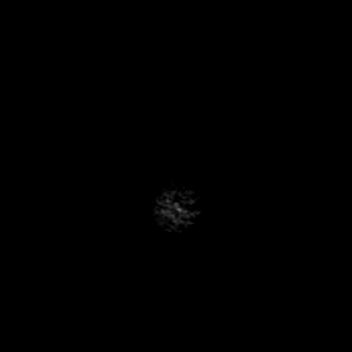

[Series 6: ax dwi_adc · axial · 3.0mm · 0.65mm/px · z∈[-93,+60]mm · 3 of 48 slices shown]
[im 1/48]
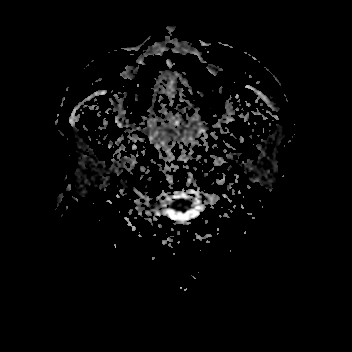
[im 24/48]
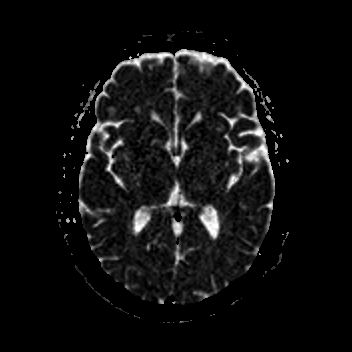
[im 48/48]
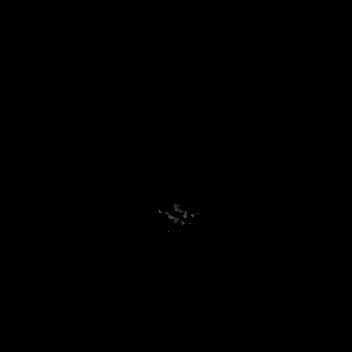

[Series 7: cor dwi_tracew · coronal · 5.0mm · 0.65mm/px · 2 of 40 slices shown]
[im 1/40]
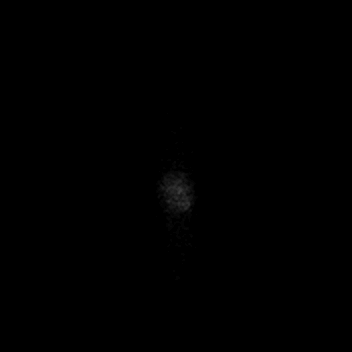
[im 40/40]
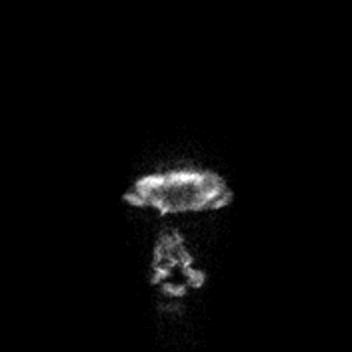

[Series 8: cor dwi_adc · coronal · 5.0mm · 0.65mm/px · 2 of 39 slices shown]
[im 1/39]
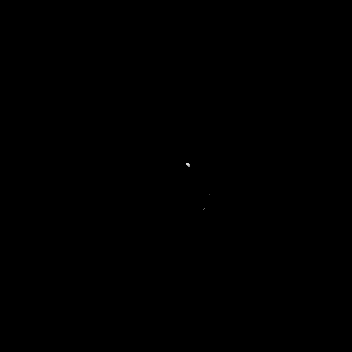
[im 39/39]
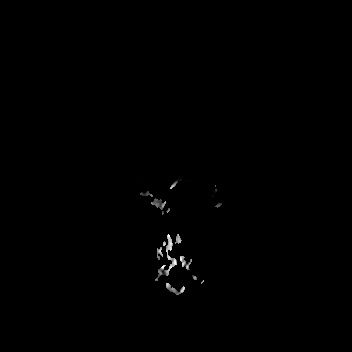

[Series 9: T1 · sagittal · 5.0mm · 0.62mm/px · 1 of 25 slices shown (1 of 2)]
[im 1/25]
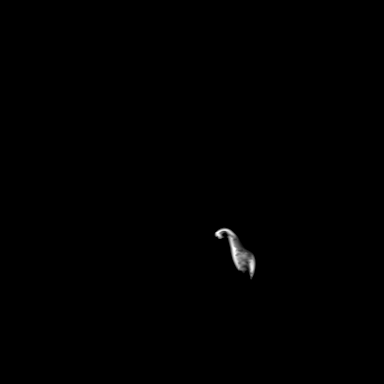

[Series 10: T2 · axial · 5.0mm · 0.53mm/px · 1 of 25 slices shown]
[im 1/25]
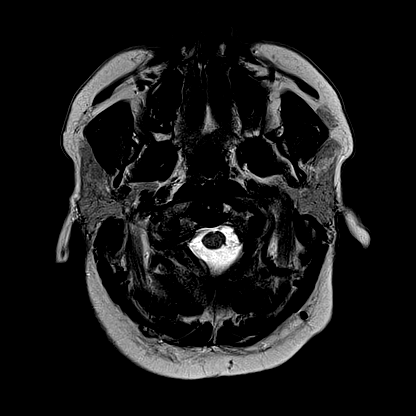

[Series 11: mag_images · axial · 3.0mm · 0.90mm/px · z∈[-103,+72]mm · 3 of 60 slices shown]
[im 1/60]
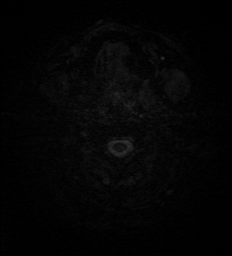
[im 30/60]
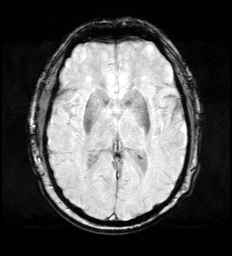
[im 60/60]
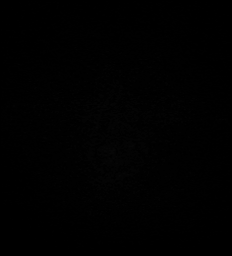

[Series 12: pha_images · axial · 3.0mm · 0.90mm/px · z∈[-103,+66]mm · 3 of 58 slices shown]
[im 1/58]
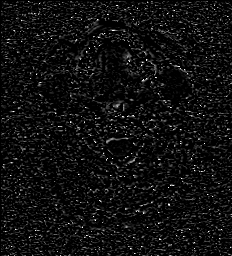
[im 29/58]
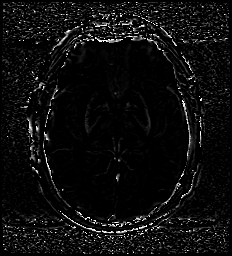
[im 58/58]
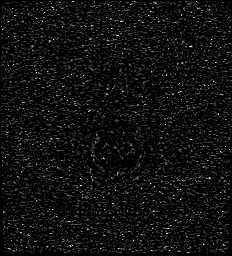

[Series 13: swi_images · axial · 3.0mm · 0.90mm/px · z∈[-103,+72]mm · 3 of 60 slices shown]
[im 1/60]
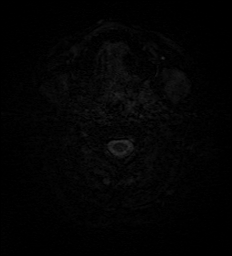
[im 30/60]
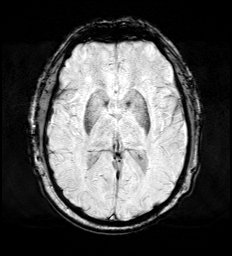
[im 60/60]
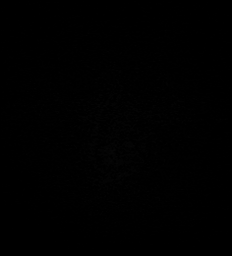

[Series 14: mip_images(sw) · axial · 24.0mm · 0.90mm/px · z∈[-93,+61]mm · 3 of 53 slices shown]
[im 1/53]
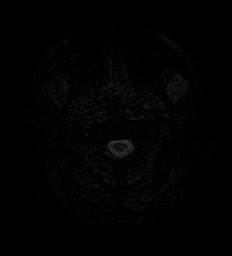
[im 27/53]
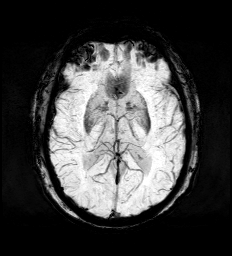
[im 53/53]
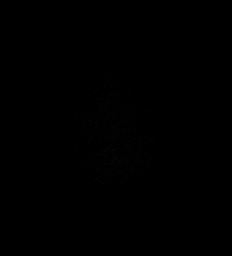

[Series 15: FLAIR · axial · 3.0mm · 0.53mm/px · z∈[-97,+63]mm · 3 of 55 slices shown]
[im 1/55]
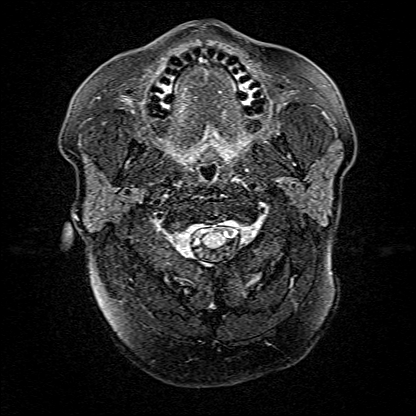
[im 28/55]
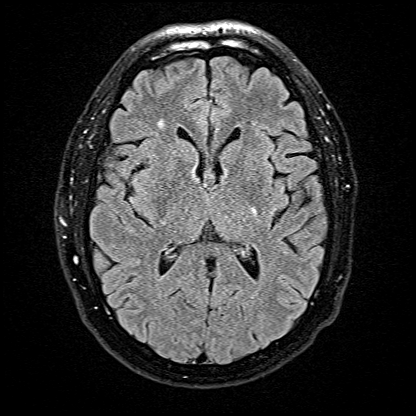
[im 55/55]
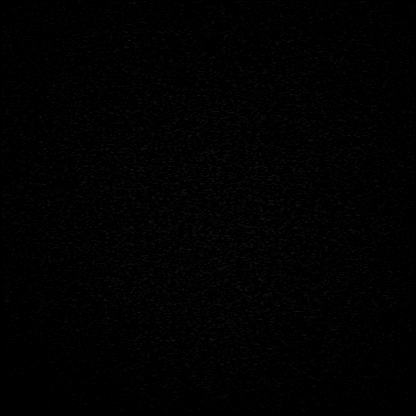

[Series 16: T1 · axial · 1.0mm · 0.98mm/px · z∈[-101,+72]mm · 9 of 176 slices shown (2 of 2)]
[im 1/176]
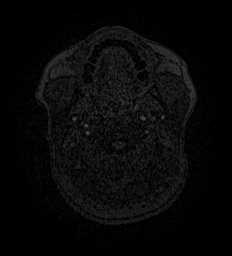
[im 22/176]
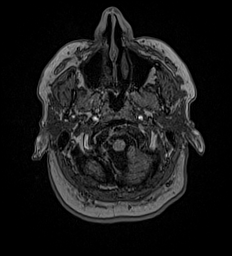
[im 44/176]
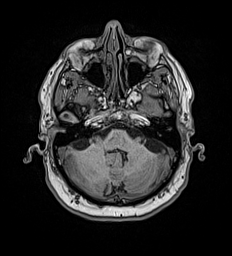
[im 66/176]
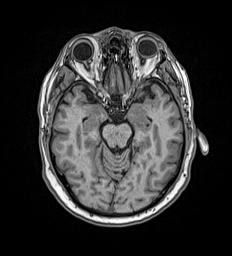
[im 88/176]
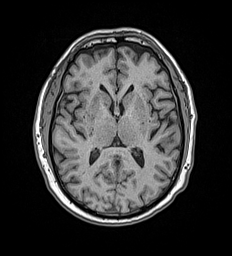
[im 110/176]
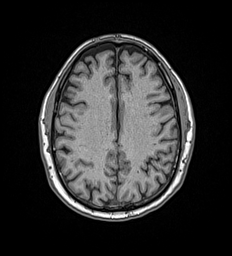
[im 132/176]
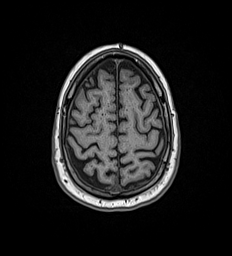
[im 154/176]
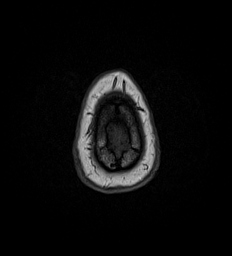
[im 176/176]
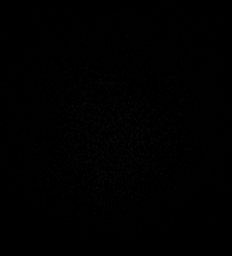

[Series 17: T2 post-contrast · coronal · 5.0mm · 0.57mm/px · 1 of 29 slices shown]
[im 1/29]
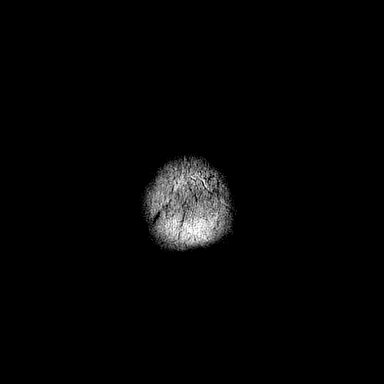

[Series 18: T1 post-contrast · axial · 1.0mm · 0.98mm/px · z∈[-101,+72]mm · 9 of 176 slices shown (1 of 3)]
[im 1/176]
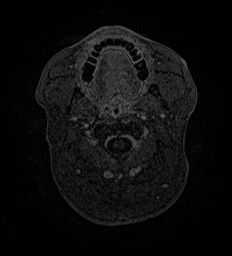
[im 22/176]
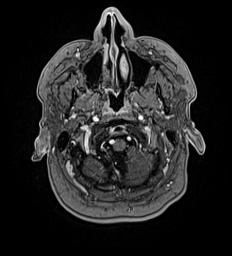
[im 44/176]
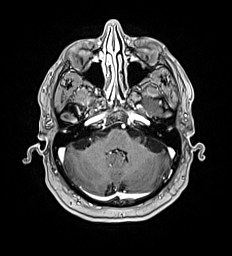
[im 66/176]
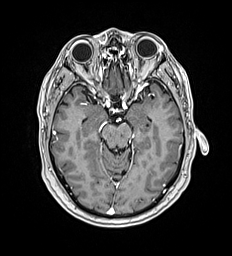
[im 88/176]
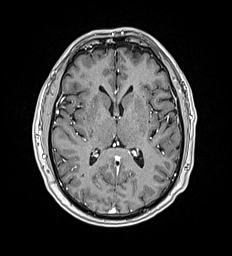
[im 110/176]
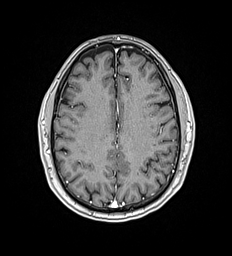
[im 132/176]
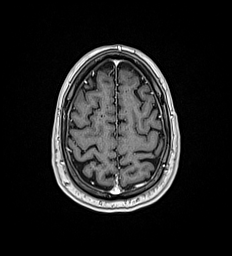
[im 154/176]
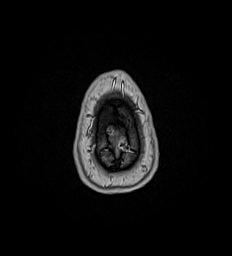
[im 176/176]
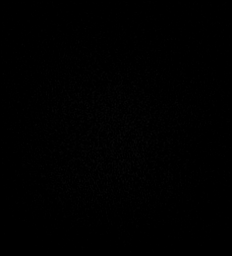

[Series 19: T1 post-contrast · coronal · 5.0mm · 0.57mm/px · 1 of 29 slices shown (2 of 3)]
[im 1/29]
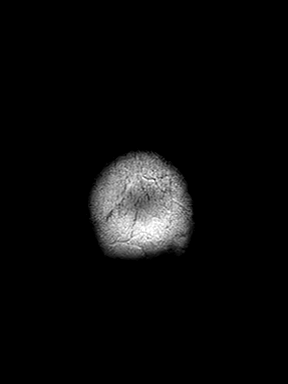

[Series 20: T1 post-contrast · sagittal · 5.0mm · 0.62mm/px · 1 of 25 slices shown (3 of 3)]
[im 1/25]
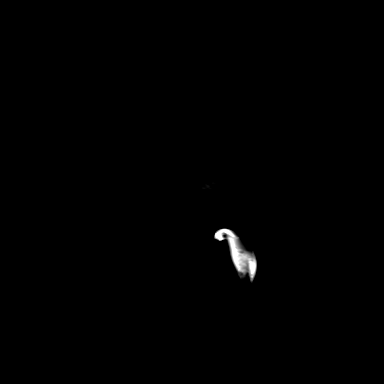

[48 of 48 positions shown; findings below may reference images not displayed]

FINDINGS: Brain:

Cerebral volume appears normal for age.

Mild multifocal T2 FLAIR hyperintense signal abnormality within the
cerebral white matter and pons, nonspecific but most often secondary
to chronic small vessel ischemia.

There is no acute infarct.

No evidence of an intracranial mass.

No chronic intracranial blood products.

No extra-axial fluid collection.

No midline shift.

No pathologic intracranial enhancement identified.

Vascular: Maintained flow voids within the proximal large arterial
vessels.

Skull and upper cervical spine: No focal suspicious marrow lesion.

Sinuses/Orbits: Visualized orbits show no acute finding. Trace
paranasal sinus mucosal thickening.
IMPRESSION: No evidence of acute intracranial abnormality.

Mild multifocal T2 FLAIR hyperintense signal abnormality within the
cerebral white matter and pons, nonspecific but most often secondary
to chronic small vessel ischemia.

## 2021-11-23 MED ORDER — GADOBUTROL 1 MMOL/ML IV SOLN
8.0000 mL | Freq: Once | INTRAVENOUS | Status: AC | PRN
  Administered 2021-11-23: 8 mL via INTRAVENOUS

## 2021-11-23 NOTE — Progress Notes (Unsigned)
BH MD/PA/NP OP Progress Note  11/23/2021 7:57 AM Jimmy Huynh  MRN:  [id number]  Chief Complaint: No chief complaint on file.  HPI: *** Visit Diagnosis: No diagnosis found.  Past Psychiatric History: Please see initial evaluation for full details. I have reviewed the history. No updates at this time.     Past Medical History:  Past Medical History:  Diagnosis Date   High cholesterol    Plantar fasciitis     Past Surgical History:  Procedure Laterality Date   HERNIA REPAIR     x3    LEG SURGERY Left     Family Psychiatric History: Please see initial evaluation for full details. I have reviewed the history. No updates at this time.     Family History:  Family History  Problem Relation Age of Onset   Alcohol abuse Maternal Uncle    Alcohol abuse Paternal Grandfather    Depression Niece     Social History:  Social History   Socioeconomic History   Marital status: Unknown    Spouse name: Not on file   Number of children: Not on file   Years of education: Not on file   Highest education level: Not on file  Occupational History   Not on file  Tobacco Use   Smoking status: Never   Smokeless tobacco: Never  Vaping Use   Vaping Use: Never used  Substance and Sexual Activity   Alcohol use: Not Currently   Drug use: Not Currently   Sexual activity: Not on file  Other Topics Concern   Not on file  Social History Narrative   Not on file   Social Determinants of Health   Financial Resource Strain: Not on file  Food Insecurity: Not on file  Transportation Needs: Not on file  Physical Activity: Not on file  Stress: Not on file  Social Connections: Not on file    Allergies: No Known Allergies  Metabolic Disorder Labs: No results found for: HGBA1C, MPG No results found for: PROLACTIN No results found for: CHOL, TRIG, HDL, CHOLHDL, VLDL, LDLCALC No results found for: TSH  Therapeutic Level Labs: No results found for: LITHIUM No results found for:  VALPROATE No components found for:  CBMZ  Current Medications: Current Outpatient Medications  Medication Sig Dispense Refill   aspirin EC 81 MG tablet Take 81 mg by mouth daily. Swallow whole.     DULoxetine (CYMBALTA) 30 MG capsule Take 1 capsule (30 mg total) by mouth daily. 30 capsule 0   loratadine (CLARITIN) 10 MG tablet Take 10 mg by mouth daily.     rosuvastatin (CRESTOR) 5 MG tablet Take 5 mg by mouth daily.     No current facility-administered medications for this visit.     Musculoskeletal: Strength & Muscle Tone:  N/A Gait & Station:  N/A Patient leans: N/A  Psychiatric Specialty Exam: Review of Systems  There were no vitals taken for this visit.There is no height or weight on file to calculate BMI.  General Appearance: {Appearance:22683}  Eye Contact:  {BHH EYE CONTACT:22684}  Speech:  Clear and Coherent  Volume:  Normal  Mood:  {BHH MOOD:22306}  Affect:  {Affect (PAA):22687}  Thought Process:  Coherent  Orientation:  Full (Time, Place, and Person)  Thought Content: Logical   Suicidal Thoughts:  {ST/HT (PAA):22692}  Homicidal Thoughts:  {ST/HT (PAA):22692}  Memory:  Immediate;   Good  Judgement:  {Judgement (PAA):22694}  Insight:  {Insight (PAA):22695}  Psychomotor Activity:  Normal  Concentration:  Concentration: Good  and Attention Span: Good  Recall:  Good  Fund of Knowledge: Good  Language: Good  Akathisia:  No  Handed:  Right  AIMS (if indicated): not done  Assets:  Communication Skills Desire for Improvement  ADL's:  Intact  Cognition: WNL  Sleep:  {BHH GOOD/FAIR/POOR:22877}   Screenings: PHQ2-9    Flowsheet Row Video Visit from 11/04/2021 in [facility]  PHQ-2 Total Score 0      Flowsheet Row ED from 02/23/2021 in [facility]  C-SSRS RISK CATEGORY No Risk        Assessment and Plan:  Jimmy Huynh is a 66 y.o. 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     1. GAD (generalized anxiety disorder) He describes symptoms consistent with discontinuation symptoms from venlafaxine, which he has been taking for more than 20 years.  Although he reports occasional anxiety and irritability, it has been self-limited, and denies any other sigfniciant mood symptoms. Psychosocial stressors includes retirement, and his son at home, who has history of drug use (he has been in sobriety, and is doing better). He  reports good relationship with his wife, and enjoys working on his house projects.  Will do a cross-taper to duloxetine slowly to see if this mitigates his symptoms.  Discussed potential risk of serotonin syndrome, discontinuation symptoms, hypertension and headache.  Noted that although fluoxetine will be a preferred agent given its half life, will not try this this time given his preference to try medication he has not taken before. Will have sooner appointment to monitor both his symptoms/mood with this medication change.    # inattention He states that he was told he has adult ADHD, and reports good benefit from venlafaxine. It is unclear whether this was more attributable to anxiety he used to experience.  Will continue to evaluate.    # r/o cognitive impairment He reports episodes of having difficulty in working memory.  ADL/IADL is independent.  It is difficult to discern whether this is more attributable discontinuation symptoms he is experiencing.  Noted that he also has physical symptoms of dizziness/vertigo in the context of taking high dose of NSAIDs/acetaminophen.  He has an upcoming appointment with neurologist.  Will continue to monitor.    Plan Decrease venlafaxine 75 mg daily for weeks, then 37.5 mg daily for two weeks, then discontinue  Start Duloxetine 30 mg daily (take along with  venlafaxine) Next appointment: 2/22 at 11:30 for 30 mins, video   The patient demonstrates the following risk factors for suicide: Chronic risk factors for suicide include: psychiatric disorder of anxiety, and chronic pain. Acute risk factors for suicide include: unemployment. Protective factors for this patient include: positive social support, responsibility to others (children, family), coping skills, hope for the future, and life satisfaction. Considering these factors, the overall suicide risk at this point appears to be low. Patient is appropriate for outpatient follow up.     Collaboration of Care: Collaboration of Care: {BH OP Collaboration of Care:21014065}  Patient/Guardian was advised Release of Information must be obtained prior to any record release in order to collaborate their care with an outside provider. Patient/Guardian was advised if they have not already done so to contact the registration department to sign all necessary forms in order for [place] to release information regarding their care.   Consent: Patient/Guardian gives verbal  consent for treatment and assignment of benefits for services provided during this visit. Patient/Guardian expressed understanding and agreed to proceed.    Neysa Hotter, MD 11/23/2021, 7:57 AM

## 2021-11-24 NOTE — Progress Notes (Signed)
Virtual Visit via Video Note  I connected with Jimmy Huynh on 11/27/21 at  8:00 AM EST by a video enabled telemedicine application and verified that I am speaking with the correct person using two identifiers.  Location: Patient: home Provider: office Persons participated in the visit- patient, provider    I discussed the limitations of evaluation and management by telemedicine and the availability of in person appointments. The patient expressed understanding and agreed to proceed.     I discussed the assessment and treatment plan with the patient. The patient was provided an opportunity to ask questions and all were answered. The patient agreed with the plan and demonstrated an understanding of the instructions.   The patient was advised to call back or seek an in-person evaluation if the symptoms worsen or if the condition fails to improve as anticipated.  I provided 26 minutes of non-face-to-face time during this encounter.   Norman Clay, MD    [facility] MD/PA/NP OP Progress Note  11/27/2021 8:35 AM Jimmy Huynh  MRN:  [id number]  Chief Complaint:  Chief Complaint  Patient presents with   Follow-up   Anxiety   HPI:  - he was seen by neurology for headache, dizziness.  Vitamin B12 was low.  He had MRI as below.   He states that he has been doing much better.  He has less dizziness, headache since the last visit.  He has been more active.  He has been working on trailer, and yard work.  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  Although he does not like this job, he was able to handle it well at the meeting the other day.  He was able to drive to [place] without any issues.  He denies feeling depressed.  He denies anhedonia.  He sleeps well; 9 hours every day.  His dreams are less vivid.  He denies change in appetite.  He denies SI.  He feels less anxious.  He denies irritability.  He denies panic attacks.  He thinks his focus has been getting better.  He also has  not had any concern about his memory since the last visit.  He denies alcohol use or drug use.    MRI- 11/2020 No evidence of acute intracranial abnormality.  Mild multifocal T2 FLAIR hyperintense signal abnormality within the cerebral white matter and pons, nonspecific but most often secondary to chronic small vessel ischemia  Daily routine: work on the house project Support: wife Household: wife, 40 yo son with methamphetamine use disorder Marital status: married for 21 years, divorced before Number of children: 2 son (1 son was adopted). 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 Generalized anxiety disorder  F41.1       Past Psychiatric History: Please see initial evaluation for full details. I have reviewed the history. No updates at this time.     Past Medical History:  Past Medical History:  Diagnosis Date   High cholesterol    Plantar fasciitis     Past Surgical History:  Procedure Laterality Date   HERNIA REPAIR     x3    LEG SURGERY Left     Family Psychiatric History: Please see initial evaluation for full details. I have reviewed the history. No updates at this time.     Family History:  Family History  Problem Relation Age  of Onset   Alcohol abuse Maternal Uncle    Alcohol abuse Paternal Grandfather    Depression Niece     Social History:  Social History   Socioeconomic History   Marital status: Unknown    Spouse name: Not on file   Number of children: Not on file   Years of education: Not on file   Highest education level: Not on file  Occupational History   Not on file  Tobacco Use   Smoking status: Never   Smokeless tobacco: Never  Vaping Use   Vaping Use: Never used  Substance and Sexual Activity   Alcohol use: Not Currently   Drug use: Not Currently   Sexual activity: Not on file  Other Topics Concern    Not on file  Social History Narrative   Not on file   Social Determinants of Health   Financial Resource Strain: Not on file  Food Insecurity: Not on file  Transportation Needs: Not on file  Physical Activity: Not on file  Stress: Not on file  Social Connections: Not on file    Allergies: No Known Allergies  Metabolic Disorder Labs: No results found for: HGBA1C, MPG No results found for: PROLACTIN No results found for: CHOL, TRIG, HDL, CHOLHDL, VLDL, LDLCALC No results found for: TSH  Therapeutic Level Labs: No results found for: LITHIUM No results found for: VALPROATE No components found for:  CBMZ  Current Medications: Current Outpatient Medications  Medication Sig Dispense Refill   aspirin EC 81 MG tablet Take 81 mg by mouth daily. Swallow whole.     [START ON 12/05/2021] DULoxetine (CYMBALTA) 30 MG capsule Take 1 capsule (30 mg total) by mouth daily. 30 capsule 1   loratadine (CLARITIN) 10 MG tablet Take 10 mg by mouth daily.     rosuvastatin (CRESTOR) 5 MG tablet Take 5 mg by mouth daily.     No current facility-administered medications for this visit.     Musculoskeletal: Strength & Muscle Tone:  N/A Gait & Station:  N/A Patient leans: N/A  Psychiatric Specialty Exam: Review of Systems  Psychiatric/Behavioral:  Negative for agitation, behavioral problems, confusion, decreased concentration, dysphoric mood, hallucinations, self-injury, sleep disturbance and suicidal ideas. The patient is nervous/anxious. The patient is not hyperactive.   All other systems reviewed and are negative.  There were no vitals taken for this visit.There is no height or weight on file to calculate BMI.  General Appearance: Fairly Groomed  Eye Contact:  Good  Speech:  Clear and Coherent  Volume:  Normal  Mood:   better  Affect:  Appropriate, Congruent, and Full Range  Thought Process:  Coherent  Orientation:  Full (Time, Place, and Person)  Thought Content: Logical   Suicidal  Thoughts:  No  Homicidal Thoughts:  No  Memory:  Immediate;   Good  Judgement:  Good  Insight:  Good  Psychomotor Activity:  Normal  Concentration:  Concentration: Good and Attention Span: Good  Recall:  Good  Fund of Knowledge: Good  Language: Good  Akathisia:  No  Handed:  Right  AIMS (if indicated): not done  Assets:  Communication Skills Desire for Improvement  ADL's:  Intact  Cognition: WNL  Sleep:  Good   Screenings: PHQ2-9    Flowsheet Row Video Visit from 11/04/2021 in [facility]  PHQ-2 Total Score 0      [place] ED from 02/23/2021 in [place] DEPT  C-SSRS RISK CATEGORY No Risk  Assessment and Plan:  Jimmy Huynh is a 66 y.o. 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   1. Generalized anxiety disorder There has been significant improvement in his mood symptoms and improvement in physical symptoms of tinnitus, dizziness since cross tapering from venlafaxine to duloxetine. Psychosocial stressors includes retirement, and his son at home, who has history of drug use (he has been in sobriety, and is doing better). He  reports good relationship with his wife, and enjoys working on his house projects.  Will continue to cross-taper from venlafaxine to duloxetine slowly to avoid discontinuation symptoms from venlafaxine.  Noted that he did try fluoxetine in the past, which did not work for him.  Will consider splitting the dose of duloxetine in the future if he experiences discontinuation symptoms from duloxetine again.    # inattention Improving as his mood improves as described above. He states that he was told he has adult ADHD, and reports good benefit from venlafaxine. It is unclear whether this was more attributable to anxiety  he used to experience.  Will continue to evaluate.    # r/o cognitive impairment Improving.  ADL/IADL is independent.  MRI with hyperintense abnormality, likely secondary to chronic small vessel ischemia.  Will continue to monitor.    Plan Discontinue venlafaxine  37.5 mg after taking it for total of 14 days Continue Duloxetine 30 mg daily  Next appointment: 4/6 at 2 PM for 30 mins, in person   The patient demonstrates the following risk factors for suicide: Chronic risk factors for suicide include: psychiatric disorder of anxiety, and chronic pain. Acute risk factors for suicide include: unemployment. Protective factors for this patient include: positive social support, responsibility to others (children, family), coping skills, hope for the future, and life satisfaction. Considering these factors, the overall suicide risk at this point appears to be low. Patient is appropriate for outpatient follow up.        Collaboration of Care: Collaboration of Care: Other reviewed note from neurology  Patient/Guardian was advised Release of Information must be obtained prior to any record release in order to collaborate their care with an outside provider. Patient/Guardian was advised if they have not already done so to contact the registration department to sign all necessary forms in order for [place] to release information regarding their care.   Consent: Patient/Guardian gives verbal consent for treatment and assignment of benefits for services provided during this visit. Patient/Guardian expressed understanding and agreed to proceed.    Norman Clay, MD 11/27/2021, 8:35 AM

## 2021-11-25 ENCOUNTER — Telehealth: Payer: Medicare HMO | Admitting: Psychiatry

## 2021-11-27 ENCOUNTER — Encounter: Payer: Self-pay | Admitting: Psychiatry

## 2021-11-27 ENCOUNTER — Other Ambulatory Visit: Payer: Self-pay

## 2021-11-27 ENCOUNTER — Telehealth (INDEPENDENT_AMBULATORY_CARE_PROVIDER_SITE_OTHER): Payer: Medicare HMO | Admitting: Psychiatry

## 2021-11-27 DIAGNOSIS — F411 Generalized anxiety disorder: Secondary | ICD-10-CM

## 2021-11-27 MED ORDER — DULOXETINE HCL 30 MG PO CPEP: 30.0000 mg | ORAL_CAPSULE | Freq: Every day | ORAL | 1 refills | Status: DC

## 2021-11-27 NOTE — Patient Instructions (Signed)
Discontinue venlafaxine  37.5 mg after taking it for total of 14 days Continue Duloxetine 30 mg daily  Next appointment: 4/6 at 2 PM in person  The next visit will be in person visit. Please arrive 15 mins before the scheduled time.   [facility]  Address: [address], [place], [place] [address]

## 2021-11-30 ENCOUNTER — Other Ambulatory Visit: Payer: Self-pay | Admitting: Psychiatry

## 2021-12-07 ENCOUNTER — Telehealth: Payer: Self-pay

## 2021-12-07 NOTE — Telephone Encounter (Signed)
pt called left message that he is having really bad headache issues and that he wants to speak with you as soon as possible.   ?

## 2021-12-07 NOTE — Telephone Encounter (Signed)
Discussed with the patient .  He states that he discontinued Effexor a few weeks ago .  He started to feel a little different , and some wobbly feeling in his ear for the past few days .  It has gotten worse this morning .  He denies taking any other pain medication anymore.  The symptom is likely secondary to discontinuation of venlafaxine given his history .  He agreed with the following.  ?-Take venlafaxine 37.5 mg every other day for a total of 14 days.  He declined to refill ?-Continue duloxetine 30 mg daily ?-He will contact the office if any worsening in his symptoms.

## 2021-12-25 ENCOUNTER — Other Ambulatory Visit: Payer: Self-pay | Admitting: Psychiatry

## 2022-01-06 NOTE — Progress Notes (Signed)
[place] MD/PA/NP OP Progress Note ? ?01/07/2022 3:01 PM ?Jimmy Huynh  ?MRN:  [id number] ? ?Chief Complaint:  ?Chief Complaint  ?Patient presents with  ? Follow-up  ? ?HPI:  ?- he was seen by neurologist for tinnitus/headache. He was diagnosed with MCI ?This is a follow-up appointment for anxiety.  ?He states that he has been doing well.  He was able to successfully discontinue venlafaxine. His tinnitus, headache, dizziness has been improving.  He states that his wife has retired a few months ago.  It has been going well so far. His son will move out of his house soon. He feels tickled to see his improvement.  He has been busy working on his boat. He also enjoys going outside, doing yard work.  He asks about evaluation/treatment of ADHD.  He denies any significant issues with focus in relate to his daily activity.  He denies feeling depressed.  He sleeps well.  He denies change in appetite.  He denies SI. He agrees to continue duloxetine at this time.  ? ? ?Daily routine: work on the house project ?Support: wife ?Household: wife, 52 yo son with methamphetamine use disorder ?Marital status: married for 21 years, divorced before ?Number of children: 2 son (1 son was adopted). 2 step children ?Employment: retired in 2021, used to be a Company secretary ?Education:  Restaurant manager, fast food degree ?Last PCP / ongoing medical evaluation:  ? ?Visit Diagnosis:  ?  ICD-10-CM   ?1. Generalized anxiety disorder  F41.1   ?  ? ? ?Past Psychiatric History: Please see initial evaluation for full details. I have reviewed the history. No updates at this time.  ?  ? ?Past Medical History:  ?Past Medical History:  ?Diagnosis Date  ? High cholesterol   ? Plantar fasciitis   ?  ?Past Surgical History:  ?Procedure Laterality Date  ? HERNIA REPAIR    ? x3   ? LEG SURGERY Left   ? ? ?Family Psychiatric History: Please see initial evaluation for full details. I have reviewed the history. No updates at this time.  ?  ? ?Family History:  ?Family History  ?Problem Relation  Age of Onset  ? Alcohol abuse Maternal Uncle   ? Alcohol abuse Paternal Grandfather   ? Depression Niece   ? ? ?Social History:  ?Social History  ? ?Socioeconomic History  ? Marital status: Unknown  ?  Spouse name: Not on file  ? Number of children: Not on file  ? Years of education: Not on file  ? Highest education level: Not on file  ?Occupational History  ? Not on file  ?Tobacco Use  ? Smoking status: Never  ? Smokeless tobacco: Never  ?Vaping Use  ? Vaping Use: Never used  ?Substance and Sexual Activity  ? Alcohol use: Not Currently  ? Drug use: Not Currently  ? Sexual activity: Not on file  ?Other Topics Concern  ? Not on file  ?Social History Narrative  ? Not on file  ? ?Social Determinants of Health  ? ?Financial Resource Strain: Not on file  ?Food Insecurity: Not on file  ?Transportation Needs: Not on file  ?Physical Activity: Not on file  ?Stress: Not on file  ?Social Connections: Not on file  ? ? ?Allergies: No Known Allergies ? ?Metabolic Disorder Labs: ?No results found for: HGBA1C, MPG ?No results found for: PROLACTIN ?No results found for: CHOL, TRIG, HDL, CHOLHDL, VLDL, LDLCALC ?No results found for: TSH ? ?Therapeutic Level Labs: ?No results found for: LITHIUM ?No results found  for: VALPROATE ?No components found for:  CBMZ ? ?Current Medications: ?Current Outpatient Medications  ?Medication Sig Dispense Refill  ? aspirin EC 81 MG tablet Take 81 mg by mouth daily. Swallow whole.    ? DULoxetine (CYMBALTA) 30 MG capsule Take 1 capsule (30 mg total) by mouth daily. 30 capsule 1  ? [START ON 02/04/2022] DULoxetine (CYMBALTA) 30 MG capsule Take 1 capsule (30 mg total) by mouth daily. 90 capsule 0  ? loratadine (CLARITIN) 10 MG tablet Take 10 mg by mouth daily.    ? rosuvastatin (CRESTOR) 5 MG tablet Take 5 mg by mouth daily.    ? ?No current facility-administered medications for this visit.  ? ? ? ?Musculoskeletal: ?Strength & Muscle Tone: within normal limits ?Gait & Station: normal ?Patient leans:  N/A ? ?Psychiatric Specialty Exam: ?Review of Systems  ?Psychiatric/Behavioral:  Negative for agitation, behavioral problems, confusion, decreased concentration, dysphoric mood, hallucinations, self-injury, sleep disturbance and suicidal ideas. The patient is not nervous/anxious and is not hyperactive.   ?All other systems reviewed and are negative.  ?Blood pressure 133/80, pulse 86, temperature 98.1 ?F (36.7 ?C), temperature source Temporal, weight 194 lb 3.2 oz (88.1 kg).Body mass index is 31.34 kg/m?.  ?General Appearance: Fairly Groomed  ?Eye Contact:  Good  ?Speech:  Clear and Coherent  ?Volume:  Normal  ?Mood:   good  ?Affect:  Appropriate, Congruent, and Full Range  ?Thought Process:  Coherent  ?Orientation:  Full (Time, Place, and Person)  ?Thought Content: Logical   ?Suicidal Thoughts:  No  ?Homicidal Thoughts:  No  ?Memory:  Immediate;   Good  ?Judgement:  Good  ?Insight:  Good  ?Psychomotor Activity:  Normal  ?Concentration:  Concentration: Good and Attention Span: Good  ?Recall:  Good  ?Fund of Knowledge: Good  ?Language: Good  ?Akathisia:  No  ?Handed:  Right  ?AIMS (if indicated): not done  ?Assets:  Communication Skills ?Desire for Improvement  ?ADL's:  Intact  ?Cognition: WNL  ?Sleep:  Good  ? ?Screenings: ?PHQ2-9   ? ?[facility] Office Visit from 01/07/2022 in [facility] Video Visit from 11/04/2021 in [facility]  ?PHQ-2 Total Score 0 0  ? ?  ? ?Flowsheet Row ED from 02/23/2021 in [facility] DE[facility]  ?C-SSRS RISK CATEGORY No Risk  ? ?  ? ? ? ?Assessment and Plan:  ?Arshdeep Fruchey is a 66 y.o. 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  ? ?1. Generalized anxiety disorder ?He reports that he improvement in physical symptoms of tinnitus, dizziness  since cross tapering from venlafaxine to duloxetine. Psychosocial stressors includes retirement, and his son at home, who has history of drug use (he has been in sobriety, and is doing better). He  reports good relationship with his wife, and enjoys working on the project.  Will continue current dose of duloxetine to target anxiety.  ? ?# inattention ?Improving as his mood improves as described above. He was reportedly diagnosed with adult ADHD. Provided psychoeducation about ADHD evaluation and his treatment.  He agrees not to pursue evaluation at this time given he denies significant interference in his activity in relate to his concentration.  Will continue to evaluate this.  ? ?# r/o cognitive impairment ?Improving.  ADL/IADL is independent.  MRI with hyperintense abnormality, likely secondary to chronic small vessel ischemia.  Will continue  to monitor.  ?  ?Plan ?Continue Duloxetine 30 mg daily  ?Next appointment: 6/28 at 8:40 for 20 mins, video ?  ?The patient demonstrates the following risk factors for suicide: Chronic risk factors for suicide include: psychiatric disorder of anxiety, and chronic pain. Acute risk factors for suicide include: unemployment. Protective factors for this patient include: positive social support, responsibility to others (children, family), coping skills, hope for the future, and life satisfaction. Considering these factors, the overall suicide risk at this point appears to be low. Patient is appropriate for outpatient follow up.  ?  ?  ?  ?  ?  ? ?Collaboration of Care: Collaboration of Care: Other N/A ? ?Patient/Guardian was advised Release of Information must be obtained prior to any record release in order to collaborate their care with an outside provider. Patient/Guardian was advised if they have not already done so to contact the registration department to sign all necessary forms in order for [place] to release information regarding their care.  ? ?Consent: Patient/Guardian  gives verbal consent for treatment and assignment of benefits for services provided during this visit. Patient/Guardian expressed understanding and agreed to proceed.  ? ? ?Norman Clay, MD ?01/07/2022, 3:01 PM ? ?

## 2022-01-07 ENCOUNTER — Ambulatory Visit (INDEPENDENT_AMBULATORY_CARE_PROVIDER_SITE_OTHER): Payer: Medicare HMO | Admitting: Psychiatry

## 2022-01-07 ENCOUNTER — Encounter: Payer: Self-pay | Admitting: Psychiatry

## 2022-01-07 VITALS — BP 133/80 | HR 86 | Temp 98.1°F | Wt 194.2 lb

## 2022-01-07 DIAGNOSIS — F411 Generalized anxiety disorder: Secondary | ICD-10-CM

## 2022-01-07 MED ORDER — DULOXETINE HCL 30 MG PO CPEP: 30.0000 mg | ORAL_CAPSULE | Freq: Every day | ORAL | 0 refills | Status: DC

## 2022-01-07 NOTE — Patient Instructions (Signed)
Continue Duloxetine 30 mg daily  ?Next appointment: 6/28 at 8:40 ?

## 2022-03-25 NOTE — Progress Notes (Signed)
Virtual Visit via Video Note  I connected with Jimmy Huynh on 03/31/22 at  8:40 AM EDT by a video enabled telemedicine application and verified that I am speaking with the correct person using two identifiers.  Location: Patient: room Provider: office Persons participated in the visit- patient, provider    I discussed the limitations of evaluation and management by telemedicine and the availability of in person appointments. The patient expressed understanding and agreed to proceed.    I discussed the assessment and treatment plan with the patient. The patient was provided an opportunity to ask questions and all were answered. The patient agreed with the plan and demonstrated an understanding of the instructions.   The patient was advised to call back or seek an in-person evaluation if the symptoms worsen or if the condition fails to improve as anticipated.  I provided 15 minutes of non-face-to-face time during this encounter.   Neysa Hotter, MD    [facility] MD/PA/NP OP Progress Note  03/31/2022 9:06 AM Jimmy Huynh  MRN:  [id number]  Chief Complaint:  Chief Complaint  Patient presents with  . Follow-up  . Anxiety   HPI:  - He was treated for Right lower quadrant abdominal abscess (CMS-HCC) since the last visit.  He states that he is currently at the trip was his family and couples.  He has been doing very well.  He denies any dizziness or any physical symptoms aside from those secondary to abdominal abscess.  Although it was difficult for him as he also has some reaction to antibiotics, he has been feeling better since then.  He has good sleep.  He has less vivid dream.  He does not take melatonin anymore as he does not want to be on medication.  He feels good about weight loss since being on diet.  He denies feeling depressed or anxiety.  He denies panic attacks.  He denies SI.  He feels comfortable to stay on the current dose of duloxetine.     Daily routine: work on the house  project Support: wife Household: wife, 10 yo son with methamphetamine use disorder Marital status: married for 21 years, divorced before Number of children: 2 son (1 son was adopted). 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 Generalized anxiety disorder  F41.1       Past Psychiatric History: Please see initial evaluation for full details. I have reviewed the history. No updates at this time.     Past Medical History:  Past Medical History:  Diagnosis Date  . High cholesterol   . Plantar fasciitis     Past Surgical History:  Procedure Laterality Date  . HERNIA REPAIR     x3   . LEG SURGERY Left     Family Psychiatric History: Please see initial evaluation for full details. I have reviewed the history. No updates at this time.     Family History:  Family History  Problem Relation Age of Onset  . Alcohol abuse Maternal Uncle   . Alcohol abuse Paternal Grandfather   . Depression Niece     Social History:  Social History   Socioeconomic History  . Marital status: Unknown    Spouse name: Not on  file  . Number of children: Not on file  . Years of education: Not on file  . Highest education level: Not on file  Occupational History  . Not on file  Tobacco Use  . Smoking status: Never  . Smokeless tobacco: Never  Vaping Use  . Vaping Use: Never used  Substance and Sexual Activity  . Alcohol use: Not Currently  . Drug use: Not Currently  . Sexual activity: Not on file  Other Topics Concern  . Not on file  Social History Narrative  . Not on file   Social Determinants of Health   Financial Resource Strain: Not on file  Food Insecurity: Not on file  Transportation Needs: Not on file  Physical Activity: Not on file   Stress: Not on file  Social Connections: Not on file    Allergies: No Known Allergies  Metabolic Disorder Labs: No results found for: "HGBA1C", "MPG" No results found for: "PROLACTIN" No results found for: "CHOL", "TRIG", "HDL", "CHOLHDL", "VLDL", "LDLCALC" No results found for: "TSH"  Therapeutic Level Labs: No results found for: "LITHIUM" No results found for: "VALPROATE" No results found for: "CBMZ"  Current Medications: Current Outpatient Medications  Medication Sig Dispense Refill  . aspirin EC 81 MG tablet Take 81 mg by mouth daily. Swallow whole.    . DULoxetine (CYMBALTA) 30 MG capsule Take 1 capsule (30 mg total) by mouth daily. 90 capsule 0  . loratadine (CLARITIN) 10 MG tablet Take 10 mg by mouth daily.    . rosuvastatin (CRESTOR) 5 MG tablet Take 5 mg by mouth daily.     No current facility-administered medications for this visit.     Musculoskeletal: Strength & Muscle Tone:  N/A Gait & Station:  N/A Patient leans: N/A  Psychiatric Specialty Exam: Review of Systems  Psychiatric/Behavioral: Negative.     There were no vitals taken for this visit.There is no height or weight on file to calculate BMI.  General Appearance: Fairly Groomed  Eye Contact:  Good  Speech:  Clear and Coherent  Volume:  Normal  Mood:   good  Affect:  Appropriate, Congruent, and Full Range  Thought Process:  Coherent  Orientation:  Full (Time, Place, and Person)  Thought Content: Logical   Suicidal Thoughts:  No  Homicidal Thoughts:  No  Memory:  Immediate;   Good  Judgement:  Good  Insight:  Good  Psychomotor Activity:  Normal  Concentration:  Concentration: Good and Attention Span: Good  Recall:  Good  Fund of Knowledge: Good  Language: Good  Akathisia:  No  Handed:  Right  AIMS (if indicated): not done  Assets:  Communication Skills Desire for Improvement  ADL's:  Intact  Cognition: WNL  Sleep:  Good   Screenings: PHQ2-9    Flowsheet Row Office Visit from  01/07/2022 in [facility] Video Visit from 11/04/2021 in [facility]  PHQ-2 Total Score 0 0      Flowsheet Row ED from 02/23/2021 in [facility]  C-SSRS RISK CATEGORY No Risk        Assessment and Plan:  Destiny Trickey is a 66 y.o. 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    1. Generalized anxiety disorder There has been a steady improvement in her anxiety since her last visit, and he denies any physical symptoms  anymore aside from symptoms secondary to abdominal abscess. Psychosocial stressors includes retirement, and his son at home, who has history of drug use (he has been in sobriety, and is doing better). He  reports good relationship with his wife, and enjoys working on the project.  Will continue duloxetine to target anxiety.    # inattention Improving as his mood improves as described above. He was reportedly diagnosed with adult ADHD. Provided psychoeducation about ADHD evaluation and his treatment.  He agrees not to pursue evaluation at this time given he denies significant interference in his activity in relate to his concentration.  Will continue to evaluate this.    # r/o cognitive impairment Unchanged.  ADL/IADL is independent.  MRI with hyperintense abnormality, likely secondary to chronic small vessel ischemia.  Will continue to monitor.    Plan Continue Duloxetine 30 mg daily  Next appointment: 9/27 at 11 AM for 30 mins, video   The patient demonstrates the following risk factors for suicide: Chronic risk factors for suicide include: psychiatric disorder of anxiety, and chronic pain. Acute risk factors for suicide include: unemployment. Protective factors for this patient include: positive social support,  responsibility to others (children, family), coping skills, hope for the future, and life satisfaction. Considering these factors, the overall suicide risk at this point appears to be low. Patient is appropriate for outpatient follow up.      Collaboration of Care: Collaboration of Care: Other N/A  Patient/Guardian was advised Release of Information must be obtained prior to any record release in order to collaborate their care with an outside provider. Patient/Guardian was advised if they have not already done so to contact the registration department to sign all necessary forms in order for [place] to release information regarding their care.   Consent: Patient/Guardian gives verbal consent for treatment and assignment of benefits for services provided during this visit. Patient/Guardian expressed understanding and agreed to proceed.    Neysa Hotter, MD 03/31/2022, 9:06 AM

## 2022-03-31 ENCOUNTER — Encounter: Payer: Self-pay | Admitting: Psychiatry

## 2022-03-31 ENCOUNTER — Telehealth (INDEPENDENT_AMBULATORY_CARE_PROVIDER_SITE_OTHER): Payer: Medicare HMO | Admitting: Psychiatry

## 2022-03-31 DIAGNOSIS — F411 Generalized anxiety disorder: Secondary | ICD-10-CM

## 2022-04-26 ENCOUNTER — Other Ambulatory Visit: Payer: Self-pay | Admitting: Psychiatry

## 2022-05-25 ENCOUNTER — Other Ambulatory Visit: Payer: Self-pay | Admitting: Surgery

## 2022-06-09 ENCOUNTER — Other Ambulatory Visit: Payer: Self-pay

## 2022-06-09 ENCOUNTER — Encounter
Admission: RE | Admit: 2022-06-09 | Discharge: 2022-06-09 | Disposition: A | Discharge status: Home / Self Care | Payer: Medicare HMO | Source: Ambulatory Visit | Attending: Surgery | Admitting: Surgery

## 2022-06-09 ENCOUNTER — Inpatient Hospital Stay: Admission: RE | Admit: 2022-06-09 | Payer: Medicare HMO | Source: Ambulatory Visit

## 2022-06-09 VITALS — Ht 66.0 in | Wt 169.5 lb

## 2022-06-09 DIAGNOSIS — Z01810 Encounter for preprocedural cardiovascular examination: Secondary | ICD-10-CM

## 2022-06-09 HISTORY — DX: Gastro-esophageal reflux disease without esophagitis: K21.9

## 2022-06-09 HISTORY — DX: Unspecified osteoarthritis, unspecified site: M19.90

## 2022-06-09 HISTORY — DX: Other complications of anesthesia, initial encounter: T88.59XA

## 2022-06-09 NOTE — Patient Instructions (Addendum)
Your procedure is scheduled on:June 17, 2022 THURSDAY Report to the Registration Desk on the 1st floor of the [facility] Mall. To find out your arrival time, please call [phone] between 1PM - 3PM on: June 16, 2022 [facility] If your arrival time is 6:00 am, do not arrive prior to that time as the [facility] Mall entrance doors do not open until 6:00 am.  REMEMBER: Instructions that are not followed completely may result in serious medical risk, up to and including death; or upon the discretion of your surgeon and anesthesiologist your surgery may need to be rescheduled.  Do not eat food after midnight the night before surgery.  No gum chewing, lozengers or hard candies.  You may however, drink CLEAR liquids up to 2 hours before you are scheduled to arrive for your surgery. Do not drink anything within 2 hours of your scheduled arrival time.  Clear liquids include: - water  - apple juice without pulp - gatorade (not RED colors) - black coffee or tea (Do NOT add milk or creamers to the coffee or tea) Do NOT drink anything that is not on this list.  Type 1 and Type 2 diabetics should only drink water.  In addition, your doctor has ordered for you to drink the provided  Ensure Pre-Surgery Clear Carbohydrate Drink  Drinking this carbohydrate drink up to two hours before surgery helps to reduce insulin resistance and improve patient outcomes. Please complete drinking 2 hours prior to scheduled arrival time.  TAKE THESE MEDICATIONS THE MORNING OF SURGERY WITH A SIP OF WATER: LORATADINE CYMBALTA FAMOTIDINE  LAST DOSE OF ASPIRIN June 09, 2022 WEDNESDAY   One week prior to surgery: Stop Anti-inflammatories (NSAIDS) such as Advil, Aleve, Ibuprofen, Motrin, Naproxen, Naprosyn and Aspirin based products such as Excedrin, Goodys Powder, BC Powder. Stop ANY OVER THE COUNTER supplements until after surgery. You may however, continue to take Tylenol if needed for pain up until the  day of surgery.  No Alcohol for 24 hours before or after surgery.  No Smoking including e-cigarettes for 24 hours prior to surgery.  No chewable tobacco products for at least 6 hours prior to surgery.  No nicotine patches on the day of surgery.  Do not use any "recreational" drugs for at least a week prior to your surgery.  Please be advised that the combination of cocaine and anesthesia may have negative outcomes, up to and including death. If you test positive for cocaine, your surgery will be cancelled.  On the morning of surgery brush your teeth with toothpaste and water, you may rinse your mouth with mouthwash if you wish. Do not swallow any toothpaste or mouthwash.  Use CHG Soap as directed on instruction sheet.  Do not wear jewelry, make-up, hairpins, clips or nail polish.  Do not wear lotions, powders, or perfumes or cologne  Do not shave body from the neck down 48 hours prior to surgery just in case you cut yourself which could leave a site for infection.  Also, freshly shaved skin may become irritated if using the CHG soap.  Contact lenses, hearing aids and dentures may not be worn into surgery.  Do not bring valuables to the hospital. [facility] is not responsible for any missing/lost belongings or valuables.   Bring your C-PAP to the hospital with you in case you may have to spend the night.   Notify your doctor if there is any change in your medical condition (cold, fever, infection).  Wear comfortable clothing (specific to  your surgery type) to the hospital.  After surgery, you can help prevent lung complications by doing breathing exercises.  Take deep breaths and cough every 1-2 hours. Your doctor may order a device called an Incentive Spirometer to help you take deep breaths. When coughing or sneezing, hold a pillow firmly against your incision with both hands. This is called "splinting." Doing this helps protect your incision. It also decreases belly  discomfort.  If you are being discharged the day of surgery, you will not be allowed to drive home. You will need a responsible adult (18 years or older) to drive you home and stay with you that night.   If you are taking public transportation, you will need to have a responsible adult (18 years or older) with you. Please confirm with your physician that it is acceptable to use public transportation.   Please call the Pre-admissions Testing Dept. at [phone] if you have any questions about these instructions.  Surgery Visitation Policy: Patients undergoing a surgery or procedure may have two family members or support persons with them as long as the person is not COVID-19 positive or experiencing its symptoms.

## 2022-06-15 ENCOUNTER — Encounter
Admission: RE | Admit: 2022-06-15 | Discharge: 2022-06-15 | Disposition: A | Discharge status: Home / Self Care | Payer: Medicare HMO | Source: Ambulatory Visit | Attending: Surgery | Admitting: Surgery

## 2022-06-15 DIAGNOSIS — Z01810 Encounter for preprocedural cardiovascular examination: Secondary | ICD-10-CM | POA: Diagnosis present

## 2022-06-16 MED ORDER — CEFAZOLIN SODIUM-DEXTROSE 2-4 GM/100ML-% IV SOLN
2.0000 g | INTRAVENOUS | Status: AC
  Administered 2022-06-17: 2 g via INTRAVENOUS

## 2022-06-17 ENCOUNTER — Other Ambulatory Visit: Payer: Self-pay

## 2022-06-17 ENCOUNTER — Ambulatory Visit: Payer: Medicare HMO | Admitting: Certified Registered"

## 2022-06-17 ENCOUNTER — Ambulatory Visit
Admission: RE | Admit: 2022-06-17 | Discharge: 2022-06-17 | Disposition: A | Discharge status: Home / Self Care | Payer: Medicare HMO | Attending: Surgery | Admitting: Surgery

## 2022-06-17 ENCOUNTER — Encounter: Payer: Self-pay | Admitting: Surgery

## 2022-06-17 ENCOUNTER — Encounter
Admission: RE | Disposition: A | Discharge status: Home / Self Care | Payer: Self-pay | Source: Home / Self Care | Attending: Surgery

## 2022-06-17 DIAGNOSIS — M94261 Chondromalacia, right knee: Secondary | ICD-10-CM | POA: Diagnosis not present

## 2022-06-17 DIAGNOSIS — M1711 Unilateral primary osteoarthritis, right knee: Secondary | ICD-10-CM | POA: Diagnosis not present

## 2022-06-17 HISTORY — PX: KNEE ARTHROSCOPY: SHX127

## 2022-06-17 SURGERY — ARTHROSCOPY, KNEE
Anesthesia: General | Site: Knee | Laterality: Right

## 2022-06-17 MED ORDER — KETOROLAC TROMETHAMINE 15 MG/ML IJ SOLN
INTRAMUSCULAR | Status: AC
  Filled 2022-06-17: qty 1

## 2022-06-17 MED ORDER — CHLORHEXIDINE GLUCONATE 0.12 % MT SOLN: 15.0000 mL | Freq: Once | OROMUCOSAL | Status: AC

## 2022-06-17 MED ORDER — LACTATED RINGERS IV SOLN: INTRAVENOUS | Status: DC

## 2022-06-17 MED ORDER — CHLORHEXIDINE GLUCONATE 0.12 % MT SOLN
OROMUCOSAL | Status: AC
  Administered 2022-06-17: 15 mL via OROMUCOSAL
  Filled 2022-06-17: qty 15

## 2022-06-17 MED ORDER — HYDROCODONE-ACETAMINOPHEN 5-325 MG PO TABS: 1.0000 | ORAL_TABLET | Freq: Four times a day (QID) | ORAL | 0 refills | Status: AC | PRN

## 2022-06-17 MED ORDER — ONDANSETRON HCL 4 MG/2ML IJ SOLN
INTRAMUSCULAR | Status: AC
  Filled 2022-06-17: qty 2

## 2022-06-17 MED ORDER — LIDOCAINE HCL 1 % IJ SOLN
INTRAMUSCULAR | Status: DC | PRN
  Administered 2022-06-17: 30 mL

## 2022-06-17 MED ORDER — DEXAMETHASONE SODIUM PHOSPHATE 10 MG/ML IJ SOLN
INTRAMUSCULAR | Status: AC
  Filled 2022-06-17: qty 1

## 2022-06-17 MED ORDER — ACETAMINOPHEN 10 MG/ML IV SOLN
INTRAVENOUS | Status: AC
  Filled 2022-06-17: qty 100

## 2022-06-17 MED ORDER — PROPOFOL 10 MG/ML IV BOLUS
INTRAVENOUS | Status: DC | PRN
  Administered 2022-06-17: 180 mg via INTRAVENOUS

## 2022-06-17 MED ORDER — FENTANYL CITRATE (PF) 100 MCG/2ML IJ SOLN
INTRAMUSCULAR | Status: DC | PRN
  Administered 2022-06-17 (×2): 50 ug via INTRAVENOUS

## 2022-06-17 MED ORDER — DEXAMETHASONE SODIUM PHOSPHATE 10 MG/ML IJ SOLN
INTRAMUSCULAR | Status: DC | PRN
  Administered 2022-06-17: 10 mg via INTRAVENOUS

## 2022-06-17 MED ORDER — ONDANSETRON HCL 4 MG/2ML IJ SOLN
INTRAMUSCULAR | Status: DC | PRN
  Administered 2022-06-17: 4 mg via INTRAVENOUS

## 2022-06-17 MED ORDER — BUPIVACAINE-EPINEPHRINE (PF) 0.5% -1:200000 IJ SOLN
INTRAMUSCULAR | Status: AC
  Filled 2022-06-17: qty 60

## 2022-06-17 MED ORDER — MIDAZOLAM HCL 2 MG/2ML IJ SOLN
INTRAMUSCULAR | Status: AC
  Filled 2022-06-17: qty 2

## 2022-06-17 MED ORDER — ORAL CARE MOUTH RINSE: 15.0000 mL | Freq: Once | OROMUCOSAL | Status: AC

## 2022-06-17 MED ORDER — LIDOCAINE HCL (CARDIAC) PF 100 MG/5ML IV SOSY
PREFILLED_SYRINGE | INTRAVENOUS | Status: DC | PRN
  Administered 2022-06-17: 100 mg via INTRAVENOUS

## 2022-06-17 MED ORDER — PHENYLEPHRINE HCL (PRESSORS) 10 MG/ML IV SOLN
INTRAVENOUS | Status: DC | PRN
  Administered 2022-06-17 (×4): 80 ug via INTRAVENOUS

## 2022-06-17 MED ORDER — OXYCODONE HCL 5 MG/5ML PO SOLN: 5.0000 mg | Freq: Once | ORAL | Status: DC | PRN

## 2022-06-17 MED ORDER — FENTANYL CITRATE (PF) 100 MCG/2ML IJ SOLN
INTRAMUSCULAR | Status: AC
  Filled 2022-06-17: qty 2

## 2022-06-17 MED ORDER — ACETAMINOPHEN 10 MG/ML IV SOLN
INTRAVENOUS | Status: DC | PRN
  Administered 2022-06-17: 1000 mg via INTRAVENOUS

## 2022-06-17 MED ORDER — DULOXETINE HCL 20 MG PO CPEP: 20.0000 mg | ORAL_CAPSULE | Freq: Every day | ORAL | 0 refills | Status: AC

## 2022-06-17 MED ORDER — RINGERS IRRIGATION IR SOLN
Status: DC | PRN
  Administered 2022-06-17: 3000 mL

## 2022-06-17 MED ORDER — LIDOCAINE HCL (PF) 1 % IJ SOLN
INTRAMUSCULAR | Status: AC
  Filled 2022-06-17: qty 30

## 2022-06-17 MED ORDER — LIDOCAINE HCL (PF) 2 % IJ SOLN
INTRAMUSCULAR | Status: AC
  Filled 2022-06-17: qty 5

## 2022-06-17 MED ORDER — KETOROLAC TROMETHAMINE 15 MG/ML IJ SOLN
15.0000 mg | Freq: Once | INTRAMUSCULAR | Status: AC
  Administered 2022-06-17: 15 mg via INTRAVENOUS

## 2022-06-17 MED ORDER — MIDAZOLAM HCL 2 MG/2ML IJ SOLN
INTRAMUSCULAR | Status: DC | PRN
  Administered 2022-06-17: 2 mg via INTRAVENOUS

## 2022-06-17 MED ORDER — PROPOFOL 10 MG/ML IV BOLUS
INTRAVENOUS | Status: AC
  Filled 2022-06-17: qty 20

## 2022-06-17 MED ORDER — BUPIVACAINE-EPINEPHRINE (PF) 0.5% -1:200000 IJ SOLN
INTRAMUSCULAR | Status: DC | PRN
  Administered 2022-06-17 (×2): 30 mL via PERINEURAL

## 2022-06-17 MED ORDER — FENTANYL CITRATE (PF) 100 MCG/2ML IJ SOLN: 25.0000 ug | INTRAMUSCULAR | Status: DC | PRN

## 2022-06-17 MED ORDER — OXYCODONE HCL 5 MG PO TABS: 5.0000 mg | ORAL_TABLET | Freq: Once | ORAL | Status: DC | PRN

## 2022-06-17 MED ORDER — CEFAZOLIN SODIUM-DEXTROSE 2-4 GM/100ML-% IV SOLN
INTRAVENOUS | Status: AC
  Filled 2022-06-17: qty 100

## 2022-06-17 SURGICAL SUPPLY — 43 items
BAG COUNTER SPONGE SURGICOUNT (BAG) IMPLANT
BLADE FULL RADIUS 3.5 (BLADE) ×1 IMPLANT
BLADE SHAVER 4.5X7 STR FR (MISCELLANEOUS) ×1 IMPLANT
BNDG ELASTIC 6X5.8 VLCR STR LF (GAUZE/BANDAGES/DRESSINGS) ×1 IMPLANT
BNDG ESMARK 6X12 TAN STRL LF (GAUZE/BANDAGES/DRESSINGS) ×1 IMPLANT
CHLORAPREP W/TINT 26 (MISCELLANEOUS) ×1 IMPLANT
COLLECTOR GRAFT TISSUE (SYSTAGENIX WOUND MANAGEMENT) ×1
CUFF TOURN SGL QUICK 24 (TOURNIQUET CUFF)
CUFF TOURN SGL QUICK 34 (TOURNIQUET CUFF)
CUFF TRNQT CYL 24X4X16.5-23 (TOURNIQUET CUFF) IMPLANT
CUFF TRNQT CYL 34X4.125X (TOURNIQUET CUFF) IMPLANT
CUP MEDICINE 2OZ PLAST GRAD ST (MISCELLANEOUS) ×1 IMPLANT
DRAPE ARTHRO LIMB 89X125 STRL (DRAPES) ×1 IMPLANT
DRAPE IMP U-DRAPE 54X76 (DRAPES) ×1 IMPLANT
ELECT REM PT RETURN 9FT ADLT (ELECTROSURGICAL) ×1
ELECTRODE REM PT RTRN 9FT ADLT (ELECTROSURGICAL) ×1 IMPLANT
GAUZE SPONGE 4X4 12PLY STRL (GAUZE/BANDAGES/DRESSINGS) ×1 IMPLANT
GLOVE BIO SURGEON STRL SZ8 (GLOVE) ×2 IMPLANT
GLOVE BIOGEL M 7.0 STRL (GLOVE) ×2 IMPLANT
GLOVE SURG UNDER LTX SZ8 (GLOVE) ×1 IMPLANT
GLOVE SURG UNDER POLY LF SZ7.5 (GLOVE) ×1 IMPLANT
GOWN STRL REUS W/ TWL LRG LVL3 (GOWN DISPOSABLE) ×1 IMPLANT
GOWN STRL REUS W/ TWL XL LVL3 (GOWN DISPOSABLE) ×2 IMPLANT
GOWN STRL REUS W/TWL LRG LVL3 (GOWN DISPOSABLE) ×1
GOWN STRL REUS W/TWL XL LVL3 (GOWN DISPOSABLE) ×2
IV LACTATED RINGER IRRG 3000ML (IV SOLUTION) ×1
IV LR IRRIG 3000ML ARTHROMATIC (IV SOLUTION) ×1 IMPLANT
KIT TURNOVER KIT A (KITS) ×1 IMPLANT
MANIFOLD NEPTUNE II (INSTRUMENTS) ×2 IMPLANT
NDL HYPO 21X1.5 SAFETY (NEEDLE) ×1 IMPLANT
NEEDLE HYPO 21X1.5 SAFETY (NEEDLE) ×1 IMPLANT
PACK ARTHROSCOPY KNEE (MISCELLANEOUS) ×1 IMPLANT
SPONGE T-LAP 18X18 ~~LOC~~+RFID (SPONGE) ×1 IMPLANT
SUT PROLENE 4 0 PS 2 18 (SUTURE) ×1 IMPLANT
SUT TICRON COATED BLUE 2 0 30 (SUTURE) IMPLANT
SYR 30ML LL (SYRINGE) ×1 IMPLANT
SYR 50ML LL SCALE MARK (SYRINGE) ×1 IMPLANT
TISSUE GRAFT COLLECTOR (SYSTAGENIX WOUND MANAGEMENT) IMPLANT
TRAP FLUID SMOKE EVACUATOR (MISCELLANEOUS) ×1 IMPLANT
TUBING INFLOW SET DBFLO PUMP (TUBING) ×1 IMPLANT
WAND 30 DEG SABER W/CORD (SURGICAL WAND) IMPLANT
WAND WEREWOLF FLOW 90D (MISCELLANEOUS) ×1 IMPLANT
WATER STERILE IRR 500ML POUR (IV SOLUTION) ×1 IMPLANT

## 2022-06-17 NOTE — H&P (Signed)
History of Present Illness: Jimmy Huynh is a 66 y.o. male who presents today for repeat evaluation of ongoing right knee pain. The patient has been seen in the past and was diagnosed with right patellofemoral compartment osteoarthritis in addition to edema in Hoffa's fat pad. The patient was offered and received a right knee steroid injection which did provide relief for a few months however he continues report pain especially on the anterior aspect of the right knee. The patient denies any medial or lateral joint line pain. He denies any catching or locking symptoms in the right knee at today's visit. He has performed therapy in the past without significant relief in addition to home exercises. The patient reports a 3 out of 10 pain score in the right knee at today's visit. The patient is quite frustrated by his continued right knee discomfort. He denies any personal history of heart attack, stroke, asthma or COPD. No personal history of blood clots.  Past Medical History: Arthritis  Hyperlipidemia  Right inguinal hernia  Seasonal allergies   Past Surgical History: Past Surgical History:  Procedure Laterality Date  COLONOSCOPY  Fracture Surgery  left groin vein repair  Right inguinal hernia repair   Past Family History: Diabetes Father  Hearing loss Father  Heart disease Father  Arthritis Mother  Vision loss Mother   Medications: Current Outpatient Medications Ordered in Epic  Medication Sig Dispense Refill  aspirin 81 MG EC tablet Take by mouth  DULoxetine (CYMBALTA) 30 MG DR capsule Take 30 mg by mouth once daily  levocetirizine (XYZAL) 5 MG tablet Take 5 mg by mouth every evening  magnesium sulfate 500 mg/mL oral solution Take by mouth once  melatonin 5 mg Tab Take by mouth at bedtime  riboflavin, vitamin B2, (VITAMIN B-2 ORAL) Take 400 mg by mouth once daily  rosuvastatin (CRESTOR) 5 MG tablet Take 5 mg by mouth once daily  vit A/vit C/vit E/zinc/copper (ICAPS AREDS ORAL)  Take by mouth 2 (two) times daily  HYDROcodone-acetaminophen (NORCO) 5-325 mg tablet Take 1 tablet by mouth every 6 (six) hours as needed for Pain for up to 20 doses (Patient not taking: Reported on 04/07/2022) 20 tablet 0   Allergies: No Known Allergies   Review of Systems:  A comprehensive 14 point ROS was performed, reviewed by me today, and the pertinent orthopaedic findings are documented in the HPI.  Physical Exam: BP (!) 142/82  Ht 167.6 cm (5\' 6" )  Wt 79.8 kg (176 lb)  BMI 28.41 kg/m  General/Constitutional: The patient appears to be well-nourished, well-developed, and in no acute distress. Neuro/Psych: Normal mood and affect, oriented to person, place and time. Eyes: Non-icteric. Pupils are equal, round, and reactive to light, and exhibit synchronous movement. ENT: Unremarkable. Lymphatic: No palpable adenopathy. Respiratory: Lungs clear to auscultation, Normal chest excursion, No wheezes, and Non-labored breathing Cardiovascular: Regular rate and rhythm. No murmurs. and No edema, swelling or tenderness, except as noted in detailed exam. Integumentary: No impressive skin lesions present, except as noted in detailed exam. Musculoskeletal: Unremarkable, except as noted in detailed exam.  General: Well developed, well nourished 66 y.o. male in no apparent distress. Normal affect. Normal communication. Patient answers questions appropriately. The patient has a normal gait. There is no antalgic component. There is no hip lurch.   Right Lower Extremity: Examination of the right lower extremity reveals no bony abnormality, no edema, mild effusion and no ecchymosis. There is no valgus or varus abnormality. The patient is non-tender along the lateral  joint line, and is non-tender along the medial joint line. The patient does have some underlying discomfort palpation especially over the patella tendon at today's visit and along the medial and lateral retinaculum. The patient has full right  knee extension and is able to flex 120 degrees with pain at the extremes of flexion. The patient has a negative rotational Mcmurray test. There is mild retropatellar discomfort. The patient has a negative patella stretch test. The patient has a negative varus stress test and a negative valgus stress test, in looking for stability. The patient has a negative Lachman's test.  Vascular: The patient has a negative Denna Haggard' test bilaterally. The patient had a normal dorsalis pedis and posterior tibial pulse. There is normal skin warmth. There is normal capillary refill bilaterally.   Neurologic: The patient has a negative straight leg raise. The patient has normal muscle strength testing for the quadriceps, calves, ankle dorsiflexion, ankle plantarflexion, and extensor hallicus longus. The patient has sensation that is intact to light touch. The deep tendon reflexes are normal at the patella and achilles. No clonus is noted.   Skin inspection of the bilateral elbow demonstrates no edema, erythema or ecchymosis. The patient has full range of motion of the left elbow without pain at today's visit. Nontender palpation over the common extensor origin bilateral at today's visit. The patient does appear to have moderate discomfort palpation over the left cubital tunnel at today's visit. He denies any significant pain with palpation over the olecranon and over the medial epicondyle at today's visit. The patient does not have any significant pain with wrist flexion or extension exercises, no significant pain with resisted motion. Mild pain with resisted supination and pronation to bilateral upper extremities at today's visit. The patient is intact to light touch to bilateral upper extremities at today's visit. Capillary refill to each individual finger is normal. The patient does have a positive Tinel's test over the left cubital tunnel.  Imaging: AP weightbearing of both knees, as well as lateral and merchant views of  the right knee were obtained at a previous visit. These films demonstrate mild degenerative changes, primarily involving the medial compartment with 20% Medial joint space narrowing. Overall alignment is neutral. There does appear to be at most mild osteophyte formation off of the lateral tibial plateau in addition to the superior aspect of the patella. No fractures, lytic lesions, or abnormal calcifications are noted.  MRI OF THE RIGHT KNEE WITHOUT CONTRAST:  1. No meniscal or ligamentous injury.  2. Mild to moderate lateral patellofemoral compartment  osteoarthritis.  3. Mild edema in the superolateral aspect of Hoffa's fat pad, which  can be seen in the setting of patellar tendon-lateral femoral  condyle friction syndrome.   Impression: 1. Impingement syndrome involving patellar fat pad 2. Patellofemoral dysfunction of right knee 3. Primary osteoarthritis of right knee  Plan:  1. Treatment options were discussed today with the patient. 2. The patient is still experiencing pain in the left elbow, he does have moderate tenderness with palpation over the cubital tunnel at today's visit. 3. The patient will undergo a nerve conduction study of bilateral upper extremities following today's appointment. 4. The patient is also continue to experience increased discomfort especially along the anterior aspect of the right knee. The patient is quite frustrated by his continued right knee discomfort and would like to discuss more aggressive treatment options. We did discuss the potential need for a knee replacement in the future despite undergoing a knee arthroscopy. 5. After  discussion of the pros and cons of surgical versus nonsurgical intervention at this time. The patient would like to proceed with a right knee arthroscopy with debridement of the Hoffa's fat pad in addition to lymphocyte reinjection and possible lateral release. 6. This document will serve as a surgical history and physical for his  upcoming procedure. 7. He will follow-up with me per standard postop protocol.  The procedure was discussed with the patient, as were the potential risks (including bleeding, infection, nerve and/or blood vessel injury, persistent or recurrent pain, failure of the repair, progression of arthritis, need for further surgery, blood clots, strokes, heart attacks and/or arhythmias, pneumonia, etc.) and benefits. The patient states his understanding and wishes to proceed.   H&P reviewed and patient re-examined. No changes.

## 2022-06-17 NOTE — Anesthesia Procedure Notes (Signed)
Procedure Name: LMA Insertion Date/Time: 06/17/2022 1:41 PM  Performed by: Morene Crocker, CRNAPre-anesthesia Checklist: Patient identified, Patient being monitored, Timeout performed, Emergency Drugs available and Suction available Patient Re-evaluated:Patient Re-evaluated prior to induction Oxygen Delivery Method: Circle system utilized Preoxygenation: Pre-oxygenation with 100% oxygen Induction Type: IV induction Ventilation: Mask ventilation without difficulty LMA: LMA inserted LMA Size: 4.0 Tube type: Oral Number of attempts: 1 Placement Confirmation: positive ETCO2 and breath sounds checked- equal and bilateral Tube secured with: Tape Dental Injury: Teeth and Oropharynx as per pre-operative assessment

## 2022-06-17 NOTE — Discharge Instructions (Addendum)
Orthopedic discharge instructions: Keep dressing dry and intact.  May shower after dressing changed on post-op day #4 (Monday).  Cover staples/sutures with Band-Aids after drying off. Apply ice frequently to knee. Take Aleve 2 tabs BID with meals for 5-7 days, then as necessary. Take pain medication as prescribed or ES Tylenol when needed.  May weight-bear as tolerated - use crutches or walker as needed. Follow-up in 10-14 days or as scheduled.  AMBULATORY SURGERY  DISCHARGE INSTRUCTIONS   The drugs that you were given will stay in your system until tomorrow so for the next 24 hours you should not:  Drive an automobile Make any legal decisions Drink any alcoholic beverage   You may resume regular meals tomorrow.  Today it is better to start with liquids and gradually work up to solid foods.  You may eat anything you prefer, but it is better to start with liquids, then soup and crackers, and gradually work up to solid foods.   Please notify your doctor immediately if you have any unusual bleeding, trouble breathing, redness and pain at the surgery site, drainage, fever, or pain not relieved by medication.    Additional Instructions:  Please contact your physician with any problems or Same Day Surgery at [phone], Monday through Friday 6 am to 4 pm, or  at [facility] number at [phone].

## 2022-06-17 NOTE — Op Note (Signed)
06/17/2022  3:10 PM  Patient:   Jimmy Huynh  Pre-Op Diagnosis:   Degenerative joint disease with lateral patellofemoral compression syndrome and infra patella fat pad impingement, right knee.  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  Surgeon:   Maryagnes Amos, MD  Anesthesia:   General LMA  Findings:   As above.  There also were areas of grade IV chondromalacia involving the lateral patellar facet and the anterior portion of the lateral femoral condyle adjacent to the femoral trochlea.  The medial compartment was in satisfactory condition, as was the articular surface of the lateral tibial plateau.  The anterior posterior cruciate ligaments both were in excellent condition, as were the medial and lateral menisci.  Complications:   None  EBL:   5 cc.  Total fluids:   500 cc of crystalloid.  Tourniquet time:   None  Drains:   None  Closure:   4-0 Prolene interrupted sutures.  Brief clinical note:   The patient is a 66 year old male with a long history of anterior right knee pain. His symptoms have progressed despite medications, activity modification, injections, etc. His history and examination are consistent with patellofemoral syndrome secondary to impingement of the infrapatellar fat pad with underlying degenerative joint disease, all of which were confirmed by MRI scan. The patient presents at this time for arthroscopy, debridement, possible lateral release, and liposomal injection of the right knee.  Procedure:   The patient was brought into the operating room and lain in the supine position. After adequate general laryngeal mask anesthesia was obtained, a timeout was performed to verify the appropriate side. The patient's right knee was injected sterilely using a solution of 30 cc of 1% lidocaine  and 30 cc of 0.5% Sensorcaine with epinephrine. The right lower extremity was prepped with ChloraPrep solution before being draped sterilely. Preoperative antibiotics were administered. The expected portal sites were injected with 0.5% Sensorcaine with epinephrine before the camera was placed in the anterolateral portal and instrumentation performed through the anteromedial portal.   The knee was sequentially examined beginning in the suprapatellar pouch, then progressing to the patellofemoral space, the medial gutter and compartment, the notch, and finally the lateral compartment and gutter. The findings were as described above. Abundant reactive synovial tissues anteriorly, medially, and laterally were debrided using the full-radius resector in order to improve visualization. These tissues were harvested with the graft net and collected for later 3 injection into the knee at the end of the case. An area of grade III-IV chondromalacia along the lateral weightbearing portion of the lateral femoral condyle was debrided back to stable margins using the full-radius resector.   Patella tracking was assessed and lateral patellofemoral compression syndrome was identified. Therefore, the camera was repositioned in the anteromedial portal and the ArthroCare wand introduced through the anterolateral portal. Under arthroscopic visualization, the lateral tenaculum was released from proximal to distal to complete the formal lateral release. Following release, the patella appeared to track more centrally and there was less compression across the lateral portion of the patellofemoral compartment. The instruments were removed from the joint after suctioning the excess fluid.    At this point, the liposomal tissues which had been harvested earlier were reinjected into the knee utilizing a Toomey syringe and red rubber catheter.  The portal sites were closed using 4-0 Prolene interrupted sutures before a sterile bulky  dressing was applied to the knee. The patient was then awakened, extubated, and returned to the recovery room in satisfactory condition after tolerating the procedure well.

## 2022-06-17 NOTE — Transfer of Care (Signed)
Immediate Anesthesia Transfer of Care Note  Patient: Jimmy Huynh  Procedure(s) Performed: RIGHT KNEE ARTHROSCOPY WITH DEBRIDEMENT OF HOFFA'S FATPAD, LIPOCYTE INJECTION AND LATERAL RELEASE (Right: Knee)  Patient Location: PACU  Anesthesia Type:General  Level of Consciousness: drowsy  Airway & Oxygen Therapy: Patient Spontanous Breathing and Patient connected to face mask oxygen  Post-op Assessment: Report given to RN and Post -op Vital signs reviewed and stable  Post vital signs: Reviewed and stable  Last Vitals:  Vitals Value Taken Time  BP 98/59 06/17/22 1453  Temp 35.9 1453  Pulse 61 06/17/22 1457  Resp 14 06/17/22 1457  SpO2 99 % 06/17/22 1457  Vitals shown include unvalidated device data.  Last Pain:  Vitals:   06/17/22 1225  PainSc: 2          Complications: No notable events documented.

## 2022-06-17 NOTE — Anesthesia Preprocedure Evaluation (Signed)
Anesthesia Evaluation  Patient identified by MRN, date of birth, ID band Patient awake    Reviewed: Allergy & Precautions, NPO status , Patient's Chart, lab work & pertinent test results  History of Anesthesia Complications (+) DIFFICULT AIRWAY and history of anesthetic complications  Airway Mallampati: II  TM Distance: >3 FB Neck ROM: full    Dental  (+) Dental Advidsory Given, Teeth Intact   Pulmonary neg shortness of breath, sleep apnea and Continuous Positive Airway Pressure Ventilation , neg recent URI,    Pulmonary exam normal        Cardiovascular (-) Past MI and (-) CABG negative cardio ROS Normal cardiovascular exam     Neuro/Psych negative neurological ROS  negative psych ROS   GI/Hepatic negative GI ROS, Neg liver ROS,   Endo/Other  negative endocrine ROS  Renal/GU      Musculoskeletal   Abdominal   Peds  Hematology negative hematology ROS (+)   Anesthesia Other Findings Past Medical History: No date: Arthritis No date: Complication of anesthesia     Comment:  PT STATES DIFFICULT INTUBATION -THROAT IS CROOKED No date: GERD (gastroesophageal reflux disease) No date: High cholesterol No date: Plantar fasciitis  Past Surgical History: No date: COLONOSCOPY No date: FRACTURE SURGERY No date: HERNIA REPAIR     Comment:  x3  No date: LEG SURGERY; Left No date: SHOULDER ARTHROSCOPY; Left  BMI    Body Mass Index: 27.36 kg/m      Reproductive/Obstetrics negative OB ROS                             Anesthesia Physical Anesthesia Plan  ASA: 2  Anesthesia Plan:    Post-op Pain Management:    Induction: Intravenous  PONV Risk Score and Plan: 2 and Dexamethasone and Ondansetron  Airway Management Planned: LMA  Additional Equipment:   Intra-op Plan:   Post-operative Plan: Extubation in OR  Informed Consent: I have reviewed the patients History and Physical,  chart, labs and discussed the procedure including the risks, benefits and alternatives for the proposed anesthesia with the patient or authorized representative who has indicated his/her understanding and acceptance.     Dental Advisory Given  Plan Discussed with: Anesthesiologist, CRNA and Surgeon  Anesthesia Plan Comments: (Patient consented for risks of anesthesia including but not limited to:  - adverse reactions to medications - damage to eyes, teeth, lips or other oral mucosa - nerve damage due to positioning  - sore throat or hoarseness - Damage to heart, brain, nerves, lungs, other parts of body or loss of life  Patient voiced understanding.)        Anesthesia Quick Evaluation

## 2022-06-18 ENCOUNTER — Encounter: Payer: Self-pay | Admitting: Surgery

## 2022-06-18 NOTE — Anesthesia Postprocedure Evaluation (Signed)
Anesthesia Post Note  Patient: Jimmy Huynh  Procedure(s) Performed: RIGHT KNEE ARTHROSCOPY WITH DEBRIDEMENT OF HOFFA'S FATPAD, LIPOCYTE INJECTION AND LATERAL RELEASE (Right: Knee)  Patient location during evaluation: PACU Anesthesia Type: General Level of consciousness: awake and alert Pain management: pain level controlled Vital Signs Assessment: post-procedure vital signs reviewed and stable Respiratory status: spontaneous breathing, nonlabored ventilation, respiratory function stable and patient connected to nasal cannula oxygen Cardiovascular status: blood pressure returned to baseline and stable Postop Assessment: no apparent nausea or vomiting Anesthetic complications: no   No notable events documented.   Last Vitals:  Vitals:   06/17/22 1542 06/17/22 1602  BP: 139/80 110/82  Pulse: 69 70  Resp: 14 15  Temp: 36.4 C 36.5 C  SpO2: 98% 100%    Last Pain:  Vitals:   06/17/22 1602  TempSrc: Temporal  PainSc: 0-No pain                 Stephanie Coup

## 2022-06-21 LAB — COLOGUARD: COLOGUARD: NEGATIVE

## 2022-06-30 ENCOUNTER — Telehealth: Payer: Medicare HMO | Admitting: Psychiatry

## 2022-08-27 ENCOUNTER — Ambulatory Visit
Admission: EM | Admit: 2022-08-27 | Discharge: 2022-08-27 | Disposition: A | Discharge status: Home / Self Care | Payer: Medicare HMO | Attending: Physician Assistant | Admitting: Physician Assistant

## 2022-08-27 DIAGNOSIS — J069 Acute upper respiratory infection, unspecified: Secondary | ICD-10-CM | POA: Diagnosis not present

## 2022-08-27 DIAGNOSIS — Z1152 Encounter for screening for COVID-19: Secondary | ICD-10-CM | POA: Insufficient documentation

## 2022-08-27 NOTE — ED Triage Notes (Signed)
[facility] presents to [facility] with cough,congestion, sinus pain, ha for 3 days. Has been taking otc cold and flu medications. Wife and son are also sick.

## 2022-08-27 NOTE — ED Provider Notes (Signed)
[facility]    CSN: [id number] Arrival date & time: 08/27/22  5809      History   Chief Complaint Chief Complaint  Patient presents with   URI    HPI Jimmy Huynh is a 66 y.o. male.   Patient here today for evaluation of nasal congestion, cough that started about 3 days ago.  He reports that his wife and child have similar symptoms.  He has not had any nausea, vomiting or diarrhea.  He has not had fever.  He has tried over-the-counter medication without significant improvement.  The history is provided by the patient.    Past Medical History:  Diagnosis Date   Arthritis    Complication of anesthesia    PT STATES DIFFICULT INTUBATION -THROAT IS CROOKED   GERD (gastroesophageal reflux disease)    High cholesterol    Plantar fasciitis     There are no problems to display for this patient.   Past Surgical History:  Procedure Laterality Date   COLONOSCOPY     FRACTURE SURGERY     HERNIA REPAIR     x3    KNEE ARTHROSCOPY Right 06/17/2022   Procedure: RIGHT KNEE ARTHROSCOPY WITH DEBRIDEMENT OF HOFFA'S FATPAD, LIPOCYTE INJECTION AND LATERAL RELEASE;  Surgeon: Christena Flake, MD;  Location: [facility] ORS;  Service: Orthopedics;  Laterality: Right;   LEG SURGERY Left    SHOULDER ARTHROSCOPY Left        Home Medications    Prior to Admission medications   Medication Sig Start Date End Date Taking? Authorizing Provider  acetaminophen (TYLENOL) 500 MG tablet Take 500 mg by mouth every 6 (six) hours as needed.    [provider]  aspirin EC 81 MG tablet Take 81 mg by mouth daily. Swallow whole.    [provider]  DULoxetine (CYMBALTA) 20 MG capsule Take 1 capsule (20 mg total) by mouth daily. 06/17/22 09/15/22  Poggi, Excell Seltzer, MD  famotidine (PEPCID) 10 MG tablet Take 10 mg by mouth daily.    [provider]  HYDROcodone-acetaminophen (NORCO) 5-325 MG tablet Take 1-2 tablets by mouth every 6 (six) hours as needed for moderate pain or  severe pain. MAXIMUM TOTAL ACETAMINOPHEN DOSE IS 4000 MG PER DAY 06/17/22   Poggi, Excell Seltzer, MD  loratadine (CLARITIN) 10 MG tablet Take 10 mg by mouth daily.    [provider]  magnesium oxide (MAG-OX) 400 (240 Mg) MG tablet Take 400 mg by mouth daily.    [provider]  Multiple Vitamins-Minerals (ICAPS AREDS 2 PO) Take 1 tablet by mouth 2 (two) times daily.    [provider]  Riboflavin (VITAMIN B-2 PO) Take 1,000 mg by mouth daily.    [provider]  rosuvastatin (CRESTOR) 5 MG tablet Take 5 mg by mouth daily.    [provider]    Family History Family History  Problem Relation Age of Onset   Alcohol abuse Maternal Uncle    Alcohol abuse Paternal Grandfather    Depression Niece     Social History Social History   Tobacco Use   Smoking status: Never   Smokeless tobacco: Never  Vaping Use   Vaping Use: Never used  Substance Use Topics   Alcohol use: Not Currently   Drug use: Not Currently     Allergies   Patient has no known allergies.   Review of Systems Review of Systems  Constitutional:  Negative for chills and fever.  HENT:  Positive for congestion  and sore throat. Negative for ear pain.   Eyes:  Negative for discharge and redness.  Respiratory:  Positive for cough. Negative for shortness of breath.   Gastrointestinal:  Negative for abdominal pain, diarrhea, nausea and vomiting.  Neurological:  Positive for headaches.     Physical Exam Triage Vital Signs ED Triage Vitals  Enc Vitals Group     BP      Pulse      Resp      Temp      Temp src      SpO2      Weight      Height      Head Circumference      Peak Flow      Pain Score      Pain Loc      Pain Edu?      Excl. in [place]?    No data found.  Updated Vital Signs BP (!) 158/81   Pulse 71   Temp 97.9 F (36.6 C) (Oral)   Resp 19   SpO2 98%   Physical Exam Vitals and nursing note reviewed.  Constitutional:      General: He is not in acute  distress.    Appearance: Normal appearance. He is not ill-appearing.  HENT:     Head: Normocephalic and atraumatic.     Right Ear: Tympanic membrane normal.     Left Ear: Tympanic membrane normal.     Nose: Congestion present.     Mouth/Throat:     Mouth: Mucous membranes are moist.     Pharynx: Oropharynx is clear. No oropharyngeal exudate or posterior oropharyngeal erythema.  Eyes:     Conjunctiva/sclera: Conjunctivae normal.  Cardiovascular:     Rate and Rhythm: Normal rate and regular rhythm.     Heart sounds: Normal heart sounds. No murmur heard. Pulmonary:     Effort: Pulmonary effort is normal. No respiratory distress.     Breath sounds: Normal breath sounds. No wheezing, rhonchi or rales.  Skin:    General: Skin is warm and dry.  Neurological:     Mental Status: He is alert.  Psychiatric:        Mood and Affect: Mood normal.        Thought Content: Thought content normal.      UC Treatments / Results  Labs (all labs ordered are listed, but only abnormal results are displayed) Labs Reviewed  SARS CORONAVIRUS 2 (TAT 6-24 HRS)    EKG   Radiology No results found.  Procedures Procedures (including critical care time)  Medications Ordered in UC Medications - No data to display  Initial Impression / Assessment and Plan / UC Course  I have reviewed the triage vital signs and the nursing notes.  Pertinent labs & imaging results that were available during my care of the patient were reviewed by me and considered in my medical decision making (see chart for details).    Suspect viral etiology of symptoms.  Will order screening for COVID.  Recommended symptomatic treatment with increased fluids and rest.  Encouraged follow-up if symptoms fail to improve or worsen.  Final Clinical Impressions(s) / UC Diagnoses   Final diagnoses:  Acute upper respiratory infection  Encounter for screening for COVID-19   Discharge Instructions   None    ED Prescriptions    None    PDMP not reviewed this encounter.   Francene Finders, PA-C 08/27/22 1030

## 2022-08-28 LAB — SARS CORONAVIRUS 2 (TAT 6-24 HRS): SARS Coronavirus 2: POSITIVE — AB

## 2023-06-01 ENCOUNTER — Emergency Department: Payer: Medicare HMO

## 2023-06-01 ENCOUNTER — Emergency Department
Admission: EM | Admit: 2023-06-01 | Discharge: 2023-06-01 | Disposition: A | Discharge status: Home / Self Care | Payer: Medicare HMO | Attending: Emergency Medicine | Admitting: Emergency Medicine

## 2023-06-01 ENCOUNTER — Other Ambulatory Visit: Payer: Self-pay

## 2023-06-01 DIAGNOSIS — K5641 Fecal impaction: Secondary | ICD-10-CM | POA: Insufficient documentation

## 2023-06-01 MED ORDER — POLYETHYLENE GLYCOL 3350 17 G PO PACK: 17.0000 g | PACK | Freq: Every day | ORAL | 0 refills | Status: AC

## 2023-06-01 MED ORDER — MAGNESIUM CITRATE PO SOLN: 1.0000 | Freq: Once | ORAL | 1 refills | Status: AC

## 2023-06-01 NOTE — ED Provider Notes (Signed)
   [facility] Provider Note    Event Date/Time   First MD Initiated Contact with Patient 06/01/23 1136     (approximate)   History   Fecal Impaction   HPI  Jimmy Huynh is a 67 y.o. male who presents with complaints of constipation secondary to use of narcotics after recent surgery.  Patient feels there is a stool ball in his rectum, no vomiting     Physical Exam   Triage Vital Signs: ED Triage Vitals [06/01/23 1114]  Encounter Vitals Group     BP (!) 143/84     Systolic BP Percentile      Diastolic BP Percentile      Pulse Rate 78     Resp 17     Temp 97.8 F (36.6 C)     Temp Source Oral     SpO2 98 %     Weight 81.6 kg (180 lb)     Height 1.676 m (5\' 6" )     Head Circumference      Peak Flow      Pain Score 0     Pain Loc      Pain Education      Exclude from Growth Chart     Most recent vital signs: Vitals:   06/01/23 1114  BP: (!) 143/84  Pulse: 78  Resp: 17  Temp: 97.8 F (36.6 C)  SpO2: 98%     General: Awake, no distress.  CV:  Good peripheral perfusion.  Resp:  Normal effort.  Abd:  No distention.  Soft, nontender Other:     ED Results / Procedures / Treatments   Labs (all labs ordered are listed, but only abnormal results are displayed) Labs Reviewed - No data to display   EKG     RADIOLOGY     PROCEDURES: yes  ------------------------------------------------------------------------------------------------------------------- Fecal Disimpaction Procedure Note:  Performed by me:  Patient placed in the lateral recumbent position with knees drawn towards chest. Nurse present for patient support. Large amount of hard brown stool removed. No complications during procedure.   ------------------------------------------------------------------------------------------------------------------    Critical Care performed:   Procedures   MEDICATIONS ORDERED IN ED: Medications - No data to  display   IMPRESSION / MDM / ASSESSMENT AND PLAN / ED COURSE  I reviewed the triage vital signs and the nursing notes. Patient's presentation is most consistent with acute complicated illness / injury requiring diagnostic workup.  KUB confirms stool ball in the rectum  Disimpaction performed successfully, recommend parallax, stool softener, magnesium citrate        FINAL CLINICAL IMPRESSION(S) / ED DIAGNOSES   Final diagnoses:  Fecal impaction (HCC)     Rx / DC Orders   ED Discharge Orders          Ordered    magnesium citrate SOLN   Once        06/01/23 1223    polyethylene glycol (MIRALAX) 17 g packet  Daily        06/01/23 1223             Note:  This document was prepared using Dragon voice recognition software and may include unintentional dictation errors.   Jene Every, MD 06/01/23 [phone]

## 2023-06-01 NOTE — ED Triage Notes (Signed)
Pt presents to ED with c/o of fecal impaction since midnight. Pt states he took supp and stool softener with no relief other than diarrhea. NAD noted.

## 2023-08-10 ENCOUNTER — Other Ambulatory Visit: Payer: Self-pay | Admitting: Physician Assistant

## 2023-08-10 DIAGNOSIS — H9313 Tinnitus, bilateral: Secondary | ICD-10-CM

## 2023-08-10 DIAGNOSIS — R42 Dizziness and giddiness: Secondary | ICD-10-CM

## 2023-08-10 DIAGNOSIS — H539 Unspecified visual disturbance: Secondary | ICD-10-CM

## 2023-08-10 DIAGNOSIS — R413 Other amnesia: Secondary | ICD-10-CM

## 2023-08-10 DIAGNOSIS — G43109 Migraine with aura, not intractable, without status migrainosus: Secondary | ICD-10-CM

## 2023-08-10 DIAGNOSIS — R519 Headache, unspecified: Secondary | ICD-10-CM

## 2023-08-24 ENCOUNTER — Encounter: Payer: Self-pay | Admitting: Physician Assistant

## 2023-08-30 ENCOUNTER — Ambulatory Visit
Admission: RE | Admit: 2023-08-30 | Discharge: 2023-08-30 | Disposition: A | Discharge status: Home / Self Care | Payer: Medicare HMO | Source: Ambulatory Visit | Attending: Physician Assistant | Admitting: Physician Assistant

## 2023-08-30 DIAGNOSIS — H539 Unspecified visual disturbance: Secondary | ICD-10-CM

## 2023-08-30 DIAGNOSIS — G43109 Migraine with aura, not intractable, without status migrainosus: Secondary | ICD-10-CM

## 2023-08-30 DIAGNOSIS — R519 Headache, unspecified: Secondary | ICD-10-CM

## 2023-08-30 DIAGNOSIS — R42 Dizziness and giddiness: Secondary | ICD-10-CM

## 2023-08-30 DIAGNOSIS — R413 Other amnesia: Secondary | ICD-10-CM

## 2023-08-30 DIAGNOSIS — H9313 Tinnitus, bilateral: Secondary | ICD-10-CM

## 2023-08-30 MED ORDER — GADOPICLENOL 0.5 MMOL/ML IV SOLN
10.0000 mL | Freq: Once | INTRAVENOUS | Status: AC | PRN
Start: 2023-08-30 — End: 2023-08-30
  Administered 2023-08-30: 9 mL via INTRAVENOUS

## 2023-10-10 DIAGNOSIS — H9313 Tinnitus, bilateral: Secondary | ICD-10-CM | POA: Diagnosis not present

## 2023-10-10 DIAGNOSIS — R42 Dizziness and giddiness: Secondary | ICD-10-CM | POA: Diagnosis not present

## 2023-10-10 DIAGNOSIS — H539 Unspecified visual disturbance: Secondary | ICD-10-CM | POA: Diagnosis not present

## 2023-10-10 DIAGNOSIS — G43109 Migraine with aura, not intractable, without status migrainosus: Secondary | ICD-10-CM | POA: Diagnosis not present

## 2023-10-10 DIAGNOSIS — M25511 Pain in right shoulder: Secondary | ICD-10-CM | POA: Diagnosis not present

## 2023-10-10 DIAGNOSIS — R413 Other amnesia: Secondary | ICD-10-CM | POA: Diagnosis not present

## 2023-10-10 DIAGNOSIS — R519 Headache, unspecified: Secondary | ICD-10-CM | POA: Diagnosis not present

## 2023-10-13 DIAGNOSIS — M25511 Pain in right shoulder: Secondary | ICD-10-CM | POA: Diagnosis not present

## 2023-10-17 DIAGNOSIS — M25511 Pain in right shoulder: Secondary | ICD-10-CM | POA: Diagnosis not present

## 2023-10-18 DIAGNOSIS — F411 Generalized anxiety disorder: Secondary | ICD-10-CM | POA: Diagnosis not present

## 2023-10-20 DIAGNOSIS — M25511 Pain in right shoulder: Secondary | ICD-10-CM | POA: Diagnosis not present

## 2023-10-24 DIAGNOSIS — M25511 Pain in right shoulder: Secondary | ICD-10-CM | POA: Diagnosis not present

## 2023-10-25 DIAGNOSIS — M25642 Stiffness of left hand, not elsewhere classified: Secondary | ICD-10-CM | POA: Diagnosis not present

## 2023-10-27 DIAGNOSIS — M25511 Pain in right shoulder: Secondary | ICD-10-CM | POA: Diagnosis not present

## 2023-10-31 DIAGNOSIS — M25511 Pain in right shoulder: Secondary | ICD-10-CM | POA: Diagnosis not present

## 2023-11-01 DIAGNOSIS — M25642 Stiffness of left hand, not elsewhere classified: Secondary | ICD-10-CM | POA: Diagnosis not present

## 2023-11-03 DIAGNOSIS — M25511 Pain in right shoulder: Secondary | ICD-10-CM | POA: Diagnosis not present

## 2023-11-08 DIAGNOSIS — M25511 Pain in right shoulder: Secondary | ICD-10-CM | POA: Diagnosis not present

## 2023-11-10 DIAGNOSIS — F411 Generalized anxiety disorder: Secondary | ICD-10-CM | POA: Diagnosis not present

## 2023-11-11 DIAGNOSIS — M25511 Pain in right shoulder: Secondary | ICD-10-CM | POA: Diagnosis not present

## 2023-11-14 DIAGNOSIS — M25511 Pain in right shoulder: Secondary | ICD-10-CM | POA: Diagnosis not present

## 2023-11-15 ENCOUNTER — Other Ambulatory Visit (HOSPITAL_COMMUNITY): Payer: Self-pay

## 2023-11-17 ENCOUNTER — Other Ambulatory Visit (HOSPITAL_COMMUNITY): Payer: Self-pay

## 2023-11-18 ENCOUNTER — Other Ambulatory Visit (HOSPITAL_COMMUNITY): Payer: Self-pay

## 2023-11-18 DIAGNOSIS — M25511 Pain in right shoulder: Secondary | ICD-10-CM | POA: Diagnosis not present

## 2023-11-18 MED ORDER — SERTRALINE HCL 50 MG PO TABS
50.0000 mg | ORAL_TABLET | Freq: Every day | ORAL | 0 refills | Status: AC
  Filled 2023-11-18: qty 90, 90d supply, fill #0

## 2023-11-19 ENCOUNTER — Other Ambulatory Visit (HOSPITAL_COMMUNITY): Payer: Self-pay

## 2023-11-21 DIAGNOSIS — M25511 Pain in right shoulder: Secondary | ICD-10-CM | POA: Diagnosis not present

## 2023-11-28 DIAGNOSIS — M25511 Pain in right shoulder: Secondary | ICD-10-CM | POA: Diagnosis not present

## 2023-12-01 DIAGNOSIS — M25611 Stiffness of right shoulder, not elsewhere classified: Secondary | ICD-10-CM | POA: Diagnosis not present

## 2023-12-13 DIAGNOSIS — M25511 Pain in right shoulder: Secondary | ICD-10-CM | POA: Diagnosis not present

## 2023-12-14 DIAGNOSIS — H539 Unspecified visual disturbance: Secondary | ICD-10-CM | POA: Diagnosis not present

## 2023-12-14 DIAGNOSIS — H9313 Tinnitus, bilateral: Secondary | ICD-10-CM | POA: Diagnosis not present

## 2023-12-14 DIAGNOSIS — G43109 Migraine with aura, not intractable, without status migrainosus: Secondary | ICD-10-CM | POA: Diagnosis not present

## 2023-12-14 DIAGNOSIS — R413 Other amnesia: Secondary | ICD-10-CM | POA: Diagnosis not present

## 2023-12-14 DIAGNOSIS — R42 Dizziness and giddiness: Secondary | ICD-10-CM | POA: Diagnosis not present

## 2024-01-05 DIAGNOSIS — F411 Generalized anxiety disorder: Secondary | ICD-10-CM | POA: Diagnosis not present

## 2024-01-18 DIAGNOSIS — R42 Dizziness and giddiness: Secondary | ICD-10-CM | POA: Diagnosis not present

## 2024-01-25 DIAGNOSIS — R42 Dizziness and giddiness: Secondary | ICD-10-CM | POA: Diagnosis not present

## 2024-01-30 DIAGNOSIS — I340 Nonrheumatic mitral (valve) insufficiency: Secondary | ICD-10-CM | POA: Diagnosis not present

## 2024-02-01 DIAGNOSIS — Z79899 Other long term (current) drug therapy: Secondary | ICD-10-CM | POA: Diagnosis not present

## 2024-02-01 DIAGNOSIS — I340 Nonrheumatic mitral (valve) insufficiency: Secondary | ICD-10-CM | POA: Diagnosis not present

## 2024-02-01 DIAGNOSIS — I371 Nonrheumatic pulmonary valve insufficiency: Secondary | ICD-10-CM | POA: Diagnosis not present

## 2024-02-01 DIAGNOSIS — R42 Dizziness and giddiness: Secondary | ICD-10-CM | POA: Diagnosis not present

## 2024-03-13 DIAGNOSIS — M7542 Impingement syndrome of left shoulder: Secondary | ICD-10-CM | POA: Diagnosis not present

## 2024-04-20 DIAGNOSIS — J019 Acute sinusitis, unspecified: Secondary | ICD-10-CM | POA: Diagnosis not present

## 2024-04-20 DIAGNOSIS — Z03818 Encounter for observation for suspected exposure to other biological agents ruled out: Secondary | ICD-10-CM | POA: Diagnosis not present

## 2024-04-20 DIAGNOSIS — B9689 Other specified bacterial agents as the cause of diseases classified elsewhere: Secondary | ICD-10-CM | POA: Diagnosis not present

## 2024-04-20 DIAGNOSIS — J029 Acute pharyngitis, unspecified: Secondary | ICD-10-CM | POA: Diagnosis not present

## 2024-05-22 DIAGNOSIS — H353131 Nonexudative age-related macular degeneration, bilateral, early dry stage: Secondary | ICD-10-CM | POA: Diagnosis not present

## 2024-05-22 DIAGNOSIS — H35713 Central serous chorioretinopathy, bilateral: Secondary | ICD-10-CM | POA: Diagnosis not present

## 2024-05-22 DIAGNOSIS — H2513 Age-related nuclear cataract, bilateral: Secondary | ICD-10-CM | POA: Diagnosis not present

## 2024-05-22 DIAGNOSIS — H43813 Vitreous degeneration, bilateral: Secondary | ICD-10-CM | POA: Diagnosis not present

## 2024-05-24 DIAGNOSIS — M7542 Impingement syndrome of left shoulder: Secondary | ICD-10-CM | POA: Diagnosis not present

## 2024-07-24 DIAGNOSIS — M7542 Impingement syndrome of left shoulder: Secondary | ICD-10-CM | POA: Diagnosis not present

## 2024-07-27 DIAGNOSIS — M75122 Complete rotator cuff tear or rupture of left shoulder, not specified as traumatic: Secondary | ICD-10-CM | POA: Diagnosis not present

## 2024-08-02 DIAGNOSIS — I341 Nonrheumatic mitral (valve) prolapse: Secondary | ICD-10-CM | POA: Diagnosis not present

## 2024-08-02 DIAGNOSIS — I340 Nonrheumatic mitral (valve) insufficiency: Secondary | ICD-10-CM | POA: Diagnosis not present

## 2024-08-02 DIAGNOSIS — Z01818 Encounter for other preprocedural examination: Secondary | ICD-10-CM | POA: Diagnosis not present

## 2024-08-02 DIAGNOSIS — I371 Nonrheumatic pulmonary valve insufficiency: Secondary | ICD-10-CM | POA: Diagnosis not present

## 2024-08-13 DIAGNOSIS — Z1331 Encounter for screening for depression: Secondary | ICD-10-CM | POA: Diagnosis not present

## 2024-08-13 DIAGNOSIS — Z0000 Encounter for general adult medical examination without abnormal findings: Secondary | ICD-10-CM | POA: Diagnosis not present

## 2024-08-13 DIAGNOSIS — J019 Acute sinusitis, unspecified: Secondary | ICD-10-CM | POA: Diagnosis not present

## 2024-08-13 DIAGNOSIS — Z01818 Encounter for other preprocedural examination: Secondary | ICD-10-CM | POA: Diagnosis not present

## 2024-08-13 DIAGNOSIS — Z125 Encounter for screening for malignant neoplasm of prostate: Secondary | ICD-10-CM | POA: Diagnosis not present

## 2024-08-13 DIAGNOSIS — I340 Nonrheumatic mitral (valve) insufficiency: Secondary | ICD-10-CM | POA: Diagnosis not present

## 2024-08-13 DIAGNOSIS — F330 Major depressive disorder, recurrent, mild: Secondary | ICD-10-CM | POA: Diagnosis not present

## 2024-08-13 DIAGNOSIS — E785 Hyperlipidemia, unspecified: Secondary | ICD-10-CM | POA: Diagnosis not present

## 2024-08-13 DIAGNOSIS — M75122 Complete rotator cuff tear or rupture of left shoulder, not specified as traumatic: Secondary | ICD-10-CM | POA: Diagnosis not present

## 2024-08-21 DIAGNOSIS — J40 Bronchitis, not specified as acute or chronic: Secondary | ICD-10-CM | POA: Diagnosis not present

## 2024-09-04 DIAGNOSIS — R053 Chronic cough: Secondary | ICD-10-CM | POA: Diagnosis not present

## 2024-09-11 DIAGNOSIS — R053 Chronic cough: Secondary | ICD-10-CM | POA: Diagnosis not present

## 2024-09-12 ENCOUNTER — Other Ambulatory Visit: Payer: Self-pay | Admitting: Family Medicine

## 2024-09-12 DIAGNOSIS — R053 Chronic cough: Secondary | ICD-10-CM

## 2024-09-19 ENCOUNTER — Ambulatory Visit: Admission: RE | Admit: 2024-09-19 | Discharge: 2024-09-19 | Attending: Family Medicine | Admitting: Family Medicine

## 2024-09-19 DIAGNOSIS — R053 Chronic cough: Secondary | ICD-10-CM | POA: Insufficient documentation
